# Patient Record
Sex: Female | Born: 1985 | Race: Black or African American | Hispanic: No | Marital: Single | Smoking: Current every day smoker
Health system: Southern US, Community
[De-identification: ages and names within clinical notes are randomized; demographics above are authoritative.]

## PROBLEM LIST (undated history)

## (undated) DIAGNOSIS — J45909 Unspecified asthma, uncomplicated: Secondary | ICD-10-CM

## (undated) DIAGNOSIS — J302 Other seasonal allergic rhinitis: Secondary | ICD-10-CM

## (undated) DIAGNOSIS — T7840XA Allergy, unspecified, initial encounter: Secondary | ICD-10-CM

## (undated) HISTORY — PX: DENTAL SURGERY: SHX609

## (undated) HISTORY — DX: Other seasonal allergic rhinitis: J30.2

## (undated) HISTORY — DX: Unspecified asthma, uncomplicated: J45.909

## (undated) HISTORY — DX: Allergy, unspecified, initial encounter: T78.40XA

---
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
## 2007-05-03 ENCOUNTER — Emergency Department (HOSPITAL_COMMUNITY): Admission: EM | Admit: 2007-05-03 | Discharge: 2007-05-03 | Payer: Self-pay | Admitting: Emergency Medicine

## 2010-07-27 NOTE — Discharge Summary (Signed)
[name], [name]               ACCOUNT NO.:  [id number]   MEDICAL RECORD NO.:  [id number]          PATIENT TYPE:  INP   LOCATION:  5126                         FACILITY:  [facility]   PHYSICIAN:  Marinda Elk, M.D.DATE OF BIRTH:  06-12-1985   DATE OF ADMISSION:  11/21/2005  DATE OF DISCHARGE:  11/25/2005                                 DISCHARGE SUMMARY   CONSULTANTS:  None.   DISCHARGE DIAGNOSES:  1. Bronchitis with bronchospasm component.  2. Allergic rhinitis.  3. Urinary tract infection.   DISCHARGE MEDICATIONS:  1. Prednisone 60 mg taper.  2. Ferrous sulfate 325 mg t.i.d. for three months.  3. Mucinex 600 mg b.i.d. for one week.  4. Advair 250/50 mg inhaled b.i.d. for two weeks.  5. Albuterol 90 mcg MDI two puffs inhaled every 4 hours p.r.n.  6. Levaquin 750 mg for seven days.  7. Nasal saline spray one puff on each nostril b.i.d. for two weeks.  8. Tylenol 650 mg q.4 hours to q.6 hours p.r.n. for fever or for pain.   DISPOSITION:  Patient is to follow up with Dr. Alena Bills.  She was  advised to call and make an appointment with him.  There, she will be  evaluated with peak flows to see patient's status and a CBC to followup on  her hemoglobin.   PROCEDURES PERFORMED:  CT angio to rule out PE.  CT was negative for PE.  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   CONSULTANTS:  None.   ADMITTING H&P:  She is an 25 year old African American female with a past  medical history of allergic rhinitis, that comes to the ED because of  shortness of breath that has gotten worse over the past 2 days.  Patient  relates chest tightness with cough and wheezing, nausea and vomiting for 3  days.  Her cough is productive, green in color.  Her tightness is diffuse.  It is a 5/10 in intensity, but refers that her cough makes it worse.  It has  no alleviating factors.  Pain has no radiation.  She relates that the  week  before some people in her dorm were complaining of some sore throat.   PHYSICAL EXAMINATION:  VITAL SIGNS:  Temperature 98.2.  Blood pressure  135/77.  Pulse 52.  Respirations 22.  Oxygen saturation 88% on room air.  LUNGS:  Poor air movement with diffuse loud wheezing and some rhonchi at the  right lower lobe.  CARDIOVASCULAR:  Regular rate and rhythm with a normal S1 and S2.  GI:  She had positive bowel sounds with no tenderness and nontender to  palpation.   LABS:  Showed a white blood cell of 14.0, H&H of 13.1, 38.1, platelets 312  with an ANC of 13.4 and an MCV of 77.  Sodium 138, potassium 3.3, chloride  107, bicarb 24, BUN 3, creatinine 0.8, glucose 147, lipase 19.  Cardiac  enzymes negative x3.  Urinary pregnancy test negative.  Urine showed ketones  and large leukocytes, but her urine microscopy  showed 7 to 10 white blood  cells with a few bacteria.  UDS was negative and strep pneumo on urine was  negative.   HOSPITAL COURSE:  1. Bronchitis with bronchospasm component.  Patient was started on Avalox      and IV fluids for hydration, steroid, albuterol and Atrovent.  After      her UA came back, she was switched to Levaquin for better coverage.  On      second day of admission, patient was wheezing so she was continued IV      steroids.  On the third day, patient's symptoms improved, so she was      switched to p.o. and was continued on p.o. steroids, Levaquin,      albuterol and Atrovent and was discharged on these medications.  2. Allergic rhinitis.  Patient was started on nasal spray and Allegra,      which improved her nasal congestion.  3. UTI.  during hospitalization urine analysis show white blood and      bacteria,she was treated with Levaquin.   LABS:  White blood cell 13.5, H&H 12.5, 36.8, platelets 322 with an MCV of  78.  Sodium 138, potassium 3.8, chloride 107, bicarb 23, BUN 11, creatinine  0.8, glucose 116.   VITAL SIGNS:  Temperature 98.  Pulse 71.   Respirations 20.  Blood pressure  127/83.  Saturating at 95% on room air.      Marinda Elk, M.D.  Electronically Signed     AF/MEDQ  D:  11/26/2005  T:  11/26/2005  Job:  [id number]   cc:   Fonnie Mu, M.D.

## 2012-02-20 ENCOUNTER — Other Ambulatory Visit: Payer: Self-pay | Admitting: Internal Medicine

## 2012-02-20 ENCOUNTER — Ambulatory Visit (INDEPENDENT_AMBULATORY_CARE_PROVIDER_SITE_OTHER): Payer: BC Managed Care – PPO | Admitting: Physician Assistant

## 2012-02-20 VITALS — BP 136/85 | HR 62 | Temp 98.5°F | Resp 16 | Ht 62.0 in | Wt 190.0 lb

## 2012-02-20 DIAGNOSIS — J029 Acute pharyngitis, unspecified: Secondary | ICD-10-CM

## 2012-02-20 DIAGNOSIS — R059 Cough, unspecified: Secondary | ICD-10-CM

## 2012-02-20 DIAGNOSIS — R05 Cough: Secondary | ICD-10-CM

## 2012-02-20 DIAGNOSIS — Z7251 High risk heterosexual behavior: Secondary | ICD-10-CM

## 2012-02-20 LAB — POCT CBC
Granulocyte percent: 64.8 %G (ref 37–80)
HCT, POC: 38.5 % (ref 37.7–47.9)
Lymph, poc: 2.7 (ref 0.6–3.4)
MCV: 89.1 fL (ref 80–97)
POC LYMPH PERCENT: 29.8 %L (ref 10–50)
RDW, POC: 14.2 %

## 2012-02-20 MED ORDER — FLUCONAZOLE 150 MG PO TABS: 150.0000 mg | ORAL_TABLET | Freq: Once | ORAL | Status: DC

## 2012-02-20 MED ORDER — HYDROCODONE-HOMATROPINE 5-1.5 MG/5ML PO SYRP: ORAL_SOLUTION | ORAL | Status: DC

## 2012-02-20 MED ORDER — AMOXICILLIN-POT CLAVULANATE 875-125 MG PO TABS: 1.0000 | ORAL_TABLET | Freq: Two times a day (BID) | ORAL | Status: DC

## 2012-02-20 NOTE — Progress Notes (Signed)
Patient ID: [name] MRN: [id number], DOB: 06/07/1985, 26 y.o. Date of Encounter: 02/20/2012, 9:11 PM  Primary Physician: No primary provider on file.  Chief Complaint: Pharyngitis and cough  HPI: 25 y.o. year old female with history below presents with complaints of pharyngitis and cough for several days now. She states that she was originally sick the previous week with nasal congestion, sinus pressure, and post nasal drip however those symptoms had resolved and she was feeling better. She then developed the above pharyngitis and cough a few days ago. She states this morning when she was brushing her teeth she looked in the mirror and saw "white spots" prompting her to come in this evening for evaluation. She states that her sore throat is a 5/10 in pain. It only hurts to swallow. Her cough is productive of yellow to green sputum. It is not associated with time of day. She has not had any shortness of breath, wheezing, or chest pain. She has remained afebrile throughout both of her above illnesses. She denies any headaches, otalgia, current sinus pressure, GI, GU, or vaginal symptoms.   She does state that she had an STD check in October that was negative. She is not certain what labs were checked. She states they checked blood, urine, and a pelvic exam was done. She does have a remote history of chlamydia and trichomonas in 2007 that was successfully treated.   She asks this evening if her sore throat could be from an STD. She has engaged in genital to oral intercourse, most recently 3 weeks prior. No new partners. She has been faithful.     Past Medical History  Diagnosis Date  . Asthma   . Seasonal allergies      Home Meds: Prior to Admission medications   Not on File    Allergies: No Known Allergies  History   Social History  . Marital Status: Single    Spouse Name: N/A    Number of Children: N/A  . Years of Education: N/A   Occupational History  . Not on file.    Social History Main Topics  . Smoking status: Current Every Day Smoker  . Smokeless tobacco: Not on file  . Alcohol Use: Yes  . Drug Use: No  . Sexually Active: Not on file   Other Topics Concern  . Not on file   Social History Narrative  . No narrative on file     Review of Systems: Constitutional: negative for chills, fever, night sweats, weight changes, or fatigue  HEENT: see above Cardiovascular: negative for chest pain, tachycardia, or palpitations Respiratory: negative for hemoptysis, wheezing, dyspnea, or shortness of breath Abdominal: negative for abdominal pain, nausea, vomiting, diarrhea, or constipation Dermatological: negative for rash Neurologic: negative for headache, dizziness, or syncope   Physical Exam: Blood pressure 136/85, pulse 62, temperature 98.5 F (36.9 C), temperature source Oral, resp. rate 16, height 5\' 2"  (1.575 m), weight 190 lb (86.183 kg), SpO2 98.00%., Body mass index is 34.75 kg/(m^2). General: Well developed, well nourished, in no acute distress. Head: Normocephalic, atraumatic, eyes without discharge, sclera non-icteric, nares are mildly congested. Bilateral auditory canals clear, TM's are without perforation, pearly grey and translucent with reflective cone of light bilaterally. Oral cavity moist, posterior pharynx erythematous with exudate bilaterally. Mild post nasal drip. No vesicular lesions along the posterior pharynx, mucosa, or gingiva. Tonsils 2+ bilaterally. Noperitonsillar abscess. Uvula midline.  Neck: Supple. No thyromegaly. Full ROM. Lymph nodes: Less than 2 cm AC bilaterally.  Lungs: Clear bilaterally to auscultation without wheezes, rales, or rhonchi. Breathing is unlabored. Heart: RRR with S1 S2. No murmurs, rubs, or gallops appreciated. Abdomen: Soft, non-tender, non-distended with normoactive bowel sounds. No hepatosplenomegaly. No rebound/guarding. No obvious abdominal masses. Negative McBurney's and table jar.  Msk:   Strength and tone normal for age. Extremities/Skin: Warm and dry. No clubbing or cyanosis. No edema. No rashes or suspicious lesions. Neuro: Alert and oriented X 3. Moves all extremities spontaneously. Gait is normal. CNII-XII grossly in tact. Psych:  Responds to questions appropriately with a normal affect.   Labs: Results for orders placed in visit on 02/20/12  POCT CBC      Component Value Range   WBC 8.9  4.6 - 10.2 K/uL   Lymph, poc 2.7  0.6 - 3.4   POC LYMPH PERCENT 29.8  10 - 50 %L   MID (cbc) 0.5  0 - 0.9   POC MID % 5.4  0 - 12 %M   POC Granulocyte 5.8  2 - 6.9   Granulocyte percent 64.8  37 - 80 %G   RBC 4.32  4.04 - 5.48 M/uL   Hemoglobin 11.8 (*) 12.2 - 16.2 g/dL   HCT, POC 14.7  82.9 - 47.9 %   MCV 89.1  80 - 97 fL   MCH, POC 27.3  27 - 31.2 pg   MCHC 30.6 (*) 31.8 - 35.4 g/dL   RDW, POC 56.2     Platelet Count, POC 300  142 - 424 K/uL   MPV 8.7  0 - 99.8 fL  POCT RAPID STREP A (OFFICE)      Component Value Range   Rapid Strep A Screen Negative  Negative   CMP, EBV titers, CMV titers, strep culture, HIV, RPR, HSV II & II, GC throat culture, chlamydia throat culture, and HSV throat culture pending. She would like for [place] to wait until all labs are back to call. We can leave a message on her voicemail, but should be vague at her request.   ASSESSMENT AND PLAN:  26 y.o. year old female with exudative pharyngitis, cough, and high risk sexual behavior.   1) Exudative pharyngitis -Augmentin 875/125 mg 1 po bid #20 no RF -See below, given Hycodan for cough -Await above laboratory work up, treat if needed -She was given a prescription for Diflucan 150 mg 1 po as needed for antibiotic use #1 RF 1  2) Cough -Hycodan #4oz 1 tsp po q 4-6 hours prn cough no RF SED -I explained to her that this medication will also help her with her sore throat  3) High risk sexual behavior -See above labs -Safe sex practices -She will follow up in 3-4 weeks for a repeat HIV as she is  currently 3 weeks out from her last sexual encounter and still may not have seroconverted.    Signed, Eula Listen, PA-C 02/20/2012 9:11 PM

## 2012-02-21 LAB — RPR

## 2012-02-21 LAB — COMPREHENSIVE METABOLIC PANEL
Alkaline Phosphatase: 64 U/L (ref 39–117)
BUN: 8 mg/dL (ref 6–23)
Creat: 0.54 mg/dL (ref 0.50–1.10)
Glucose, Bld: 87 mg/dL (ref 70–99)
Sodium: 139 mEq/L (ref 135–145)
Total Bilirubin: 0.3 mg/dL (ref 0.3–1.2)
Total Protein: 6.9 g/dL (ref 6.0–8.3)

## 2012-02-21 LAB — HIV ANTIBODY (ROUTINE TESTING W REFLEX): HIV: NONREACTIVE

## 2012-02-23 LAB — CULTURE, GROUP A STREP

## 2012-02-24 LAB — EPSTEIN-BARR VIRUS VCA ANTIBODY PANEL
EBV EA IgG: <5 U/mL (ref <9.0)
EBV NA IgG: 194 U/mL — ABNORMAL HIGH (ref <18.0)
EBV VCA IgM: <10 U/mL (ref <36.0)

## 2012-02-24 LAB — CYTOMEGALOVIRUS ANTIBODY, IGG: Cytomegalovirus Ab-IgG: 0.51 (ref <0.90)

## 2012-02-24 LAB — CMV IGM: CMV IgM: 0.11 (ref <0.90)

## 2012-07-18 ENCOUNTER — Ambulatory Visit (INDEPENDENT_AMBULATORY_CARE_PROVIDER_SITE_OTHER): Payer: BC Managed Care – PPO | Admitting: Family Medicine

## 2012-07-18 VITALS — BP 100/70 | HR 86 | Temp 98.8°F | Resp 16 | Ht 62.25 in | Wt 203.4 lb

## 2012-07-18 DIAGNOSIS — Z0000 Encounter for general adult medical examination without abnormal findings: Secondary | ICD-10-CM

## 2012-07-18 DIAGNOSIS — N760 Acute vaginitis: Secondary | ICD-10-CM

## 2012-07-18 DIAGNOSIS — B9689 Other specified bacterial agents as the cause of diseases classified elsewhere: Secondary | ICD-10-CM

## 2012-07-18 MED ORDER — METRONIDAZOLE 500 MG PO TABS: 500.0000 mg | ORAL_TABLET | Freq: Three times a day (TID) | ORAL | Status: DC

## 2012-07-18 NOTE — Progress Notes (Signed)
Is a 27 year old unmarried woman comes in for Pap test. She has implant on insertions inner right axilla. She has occasional spotting and occasional odor. She's having no pain, breast tenderness, other complaints at the present time. Last Pap was 2012.  Patient has a history of abnormal Paps dating back to 2009 but subsequent Paps within normal  Objective: No acute distress Chest: Clear Heart: Regular no murmur Breasts: No masses palpated Skin: Clear Pelvic exam: Normal external female genitalia, normal vagina, normal cervix, normal bimanual Pap taken  Assessment: Normal annual exam  Plan: Pap 2 ordered BV (bacterial vaginosis)  Annual physical exam - Plan: metroNIDAZOLE (FLAGYL) 500 MG tablet  Signed, Elvina Sidle, MD

## 2012-08-17 ENCOUNTER — Encounter: Payer: BC Managed Care – PPO | Admitting: Physician Assistant

## 2012-12-14 ENCOUNTER — Ambulatory Visit (INDEPENDENT_AMBULATORY_CARE_PROVIDER_SITE_OTHER): Payer: BC Managed Care – PPO | Admitting: Family Medicine

## 2012-12-14 VITALS — BP 124/70 | HR 71 | Temp 99.0°F | Resp 16 | Ht 62.0 in | Wt 209.0 lb

## 2012-12-14 DIAGNOSIS — R109 Unspecified abdominal pain: Secondary | ICD-10-CM

## 2012-12-14 DIAGNOSIS — Z0000 Encounter for general adult medical examination without abnormal findings: Secondary | ICD-10-CM

## 2012-12-14 LAB — POCT URINALYSIS DIPSTICK
Bilirubin, UA: NEGATIVE
Blood, UA: NEGATIVE
Glucose, UA: NEGATIVE
Ketones, UA: NEGATIVE
Leukocytes, UA: NEGATIVE
pH, UA: 6.5

## 2012-12-14 LAB — POCT UA - MICROSCOPIC ONLY
Mucus, UA: NEGATIVE
RBC, urine, microscopic: NEGATIVE

## 2012-12-14 LAB — POCT CBC
Granulocyte percent: 55.2 %G (ref 37–80)
HCT, POC: 43.5 % (ref 37.7–47.9)
Hemoglobin: 14.1 g/dL (ref 12.2–16.2)
MPV: 9.2 fL (ref 0–99.8)
POC Granulocyte: 3.8 (ref 2–6.9)
RBC: 4.93 M/uL (ref 4.04–5.48)

## 2012-12-14 LAB — COMPREHENSIVE METABOLIC PANEL
ALT: 24 U/L (ref 0–35)
Albumin: 4.5 g/dL (ref 3.5–5.2)
CO2: 26 mEq/L (ref 19–32)
Calcium: 9.8 mg/dL (ref 8.4–10.5)
Chloride: 105 mEq/L (ref 96–112)
Creat: 0.66 mg/dL (ref 0.50–1.10)
Potassium: 4.2 mEq/L (ref 3.5–5.3)

## 2012-12-14 LAB — AMYLASE: Amylase: 35 U/L (ref 0–105)

## 2012-12-14 MED ORDER — CYCLOBENZAPRINE HCL 10 MG PO TABS: 10.0000 mg | ORAL_TABLET | Freq: Two times a day (BID) | ORAL | Status: DC | PRN

## 2012-12-14 NOTE — Patient Instructions (Addendum)
Your labs look good so far- no sign of a kidney stone.  I will be in touch regarding the rest of your labs when they come in.   Try the flexeril medication as needed for pain.  Remember this can make you drowsy so do not take it when you need to drive.

## 2012-12-14 NOTE — Progress Notes (Signed)
[facility] [address] [place] [address] [phone]  Date:  12/14/2012   Name:  [name]   DOB:  1985-06-16   MRN:  [id number]  PCP:  Nilda Simmer, MD    Chief Complaint: Flank Pain   History of Present Illness:  [name] is a 27 y.o. very pleasant female patient who presents with the following:  She has noted pain in her left side and shoulder since Saturday- today is Monday.  NKI.  It will hurt if she moves in a particular way such as with bending down or stretching to the right.    It is not getting better or worse.  The pain does not run down her leg.  No leg numbness or weakness.  No hematuria, no urinary sx or bowel/ bladder incontinence.  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  However she does wear a heavy service belt each day which she thinks may have irritated her back.    She has never had this in the past.   She has implanon so she rarely has menses  There are no active problems to display for this patient.   Past Medical History  Diagnosis Date  . Asthma   . Seasonal allergies     No past surgical history on file.  History  Substance Use Topics  . Smoking status: Current Every Day Smoker  . Smokeless tobacco: Not on file  . Alcohol Use: 1.0 oz/week    2 drink(s) per week    Family History  Problem Relation Age of Onset  . Cancer Father   . Liver disease Father   . Liver disease Maternal Grandfather     No Known Allergies  Medication list has been reviewed and updated.  Current Outpatient Prescriptions on File Prior to Visit  Medication Sig Dispense Refill  . metroNIDAZOLE (FLAGYL) 500 MG tablet Take 1 tablet (500 mg total) by mouth 3 (three) times daily.  21 tablet  0   No current facility-administered medications on file prior to visit.    Review of Systems:  As per HPI- otherwise  negative.   Physical Examination: Filed Vitals:   12/14/12 0933  BP: 124/70  Pulse: 71  Temp: 99 F (37.2 C)  Resp: 16   Filed Vitals:   12/14/12 0933  Height: 5\' 2"  (1.575 m)  Weight: 209 lb (94.802 kg)   Body mass index is 38.22 kg/(m^2). Ideal Body Weight: Weight in (lb) to have BMI = 25: 136.4  GEN: WDWN, NAD, Non-toxic, A & O x 3, obese HEENT: Atraumatic, Normocephalic. Neck supple. No masses, No LAD. Ears and Nose: No external deformity. CV: RRR, No M/G/R. No JVD. No thrill. No extra heart sounds. PULM: CTA B, no wheezes, crackles, rhonchi. No retractions. No resp. distress. No accessory muscle use. ABD: S, NT, ND, +BS. No rebound. No HSM. She is tender over the left mid back, and in her left mid side (not distinctly in the left abdomen, but in the left side)   Good back flexion.  Negative SLR bilaterally.  Normal LE strength, sensation and DTR EXTR: No c/c/e NEURO Normal gait.  PSYCH: Normally interactive. Conversant. Not depressed or anxious appearing.  Calm demeanor.     Assessment and Plan: Flank pain - Plan: POCT CBC, POCT UA - Microscopic Only, POCT urinalysis dipstick, Comprehensive metabolic panel, Amylase, Lipase, POCT  urine pregnancy  Left sided abdominal pain - Plan: POCT CBC, POCT UA - Microscopic Only, POCT urinalysis dipstick, Comprehensive metabolic panel, Amylase, Lipase, cyclobenzaprine (FLEXERIL) 10 MG tablet  Annual physical exam  MSK strain in the left lower back and obliques.  Her police belt (combined with her weight) may have contributed.  Discussed weight loss and better fitness for her health.  She will use flexeril as needed, and I will be in touch with the rest of her labs.    Signed Abbe Amsterdam, MD  Received the rest of her labs and gave her a call- all normal.  She will let me know if her sx persist.    Results for orders placed in visit on 12/14/12  COMPREHENSIVE METABOLIC PANEL      Result Value Range   Sodium 138  135 - 145  mEq/L   Potassium 4.2  3.5 - 5.3 mEq/L   Chloride 105  96 - 112 mEq/L   CO2 26  19 - 32 mEq/L   Glucose, Bld 86  70 - 99 mg/dL   BUN 10  6 - 23 mg/dL   Creat 6.21  3.08 - 6.57 mg/dL   Total Bilirubin 0.3  0.3 - 1.2 mg/dL   Alkaline Phosphatase 64  39 - 117 U/L   AST 17  0 - 37 U/L   ALT 24  0 - 35 U/L   Total Protein 7.1  6.0 - 8.3 g/dL   Albumin 4.5  3.5 - 5.2 g/dL   Calcium 9.8  8.4 - 84.6 mg/dL  AMYLASE      Result Value Range   Amylase 35  0 - 105 U/L  LIPASE      Result Value Range   Lipase 16  0 - 75 U/L  POCT CBC      Result Value Range   WBC 6.8  4.6 - 10.2 K/uL   Lymph, poc 2.5  0.6 - 3.4   POC LYMPH PERCENT 36.9  10 - 50 %L   MID (cbc) 0.5  0 - 0.9   POC MID % 7.9  0 - 12 %M   POC Granulocyte 3.8  2 - 6.9   Granulocyte percent 55.2  37 - 80 %G   RBC 4.93  4.04 - 5.48 M/uL   Hemoglobin 14.1  12.2 - 16.2 g/dL   HCT, POC 96.2  95.2 - 47.9 %   MCV 88.2  80 - 97 fL   MCH, POC 28.6  27 - 31.2 pg   MCHC 32.4  31.8 - 35.4 g/dL   RDW, POC 84.1     Platelet Count, POC 302  142 - 424 K/uL   MPV 9.2  0 - 99.8 fL  POCT UA - MICROSCOPIC ONLY      Result Value Range   WBC, Ur, HPF, POC 0-1     RBC, urine, microscopic neg     Bacteria, U Microscopic trace     Mucus, UA neg     Epithelial cells, urine per micros 0-1     Crystals, Ur, HPF, POC neg     Casts, Ur, LPF, POC neg     Yeast, UA neg    POCT URINALYSIS DIPSTICK      Result Value Range   Color, UA yellow     Clarity, UA clear     Glucose, UA neg     Bilirubin, UA neg     Ketones, UA neg     Spec Grav,  UA 1.015     Blood, UA neg     pH, UA 6.5     Protein, UA neg     Urobilinogen, UA 0.2     Nitrite, UA neg     Leukocytes, UA Negative    POCT URINE PREGNANCY      Result Value Range   Preg Test, Ur Negative

## 2014-02-01 ENCOUNTER — Encounter: Payer: Self-pay | Admitting: Family Medicine

## 2014-02-01 ENCOUNTER — Ambulatory Visit (INDEPENDENT_AMBULATORY_CARE_PROVIDER_SITE_OTHER): Payer: BC Managed Care – PPO | Admitting: Family Medicine

## 2014-02-01 VITALS — BP 130/74 | HR 82 | Temp 98.2°F | Resp 16 | Ht 62.5 in | Wt 217.4 lb

## 2014-02-01 DIAGNOSIS — Z0000 Encounter for general adult medical examination without abnormal findings: Secondary | ICD-10-CM

## 2014-02-01 DIAGNOSIS — Z131 Encounter for screening for diabetes mellitus: Secondary | ICD-10-CM

## 2014-02-01 DIAGNOSIS — Z13220 Encounter for screening for lipoid disorders: Secondary | ICD-10-CM

## 2014-02-01 DIAGNOSIS — Z7251 High risk heterosexual behavior: Secondary | ICD-10-CM

## 2014-02-01 LAB — COMPLETE METABOLIC PANEL WITH GFR
ALK PHOS: 78 U/L (ref 39–117)
ALT: 17 U/L (ref 0–35)
AST: 16 U/L (ref 0–37)
Albumin: 4.3 g/dL (ref 3.5–5.2)
BILIRUBIN TOTAL: 0.4 mg/dL (ref 0.2–1.2)
BUN: 12 mg/dL (ref 6–23)
CO2: 25 mEq/L (ref 19–32)
Calcium: 9 mg/dL (ref 8.4–10.5)
Chloride: 106 mEq/L (ref 96–112)
Creat: 0.63 mg/dL (ref 0.50–1.10)
GFR, Est African American: >89 mL/min
GFR, Est Non African American: >89 mL/min
Glucose, Bld: 85 mg/dL (ref 70–99)
Potassium: 4.2 mEq/L (ref 3.5–5.3)
SODIUM: 139 meq/L (ref 135–145)
TOTAL PROTEIN: 6.8 g/dL (ref 6.0–8.3)

## 2014-02-01 LAB — POCT URINALYSIS DIPSTICK
BILIRUBIN UA: NEGATIVE
Glucose, UA: NEGATIVE
KETONES UA: NEGATIVE
LEUKOCYTES UA: NEGATIVE
Nitrite, UA: NEGATIVE
PH UA: 6.5
Protein, UA: NEGATIVE
RBC UA: NEGATIVE
Spec Grav, UA: <=1.005
Urobilinogen, UA: 0.2

## 2014-02-01 LAB — LIPID PANEL
CHOL/HDL RATIO: 4.3 ratio
Cholesterol: 185 mg/dL (ref 0–200)
HDL: 43 mg/dL (ref >39)
LDL CALC: 128 mg/dL — AB (ref 0–99)
Triglycerides: 71 mg/dL (ref <150)
VLDL: 14 mg/dL (ref 0–40)

## 2014-02-01 LAB — HEMOGLOBIN A1C
HEMOGLOBIN A1C: 5.7 % — AB (ref <5.7)
Mean Plasma Glucose: 117 mg/dL — ABNORMAL HIGH (ref <117)

## 2014-02-01 NOTE — Progress Notes (Signed)
Subjective:    Patient ID: [name], female    DOB: 11/17/1985, 28 y.o.   MRN: [id number]  HPI Patient presents today for CPE. She does not wish to have her gyn exam today.   Last CPE- unknown Last PAP- a little over a year ago. Has had abnormal in past with biopsy. Flu- declines Tdap- she believes she had to have for her job in the last couple of years Dental- brushes and flosses regularly, due for cleaning Eye- will make opthalmology appointment to evaluate dry eyes   The patient is a Engineer, drillingprobation officer. She enjoys her work and admits it can be stressful. She does a combination of desk work and walking with her job. She does not cook, so she eats fast food twice a day. She drinks 2-3 can regular soda a day. She does not exercise regularly although she has been trying to walk more during the day.   Past Medical History  Diagnosis Date  . Asthma   . Seasonal allergies    No past surgical history on file. Family History  Problem Relation Age of Onset  . Cancer Father   . Liver disease Father   . Liver disease Maternal Grandfather    History  Substance Use Topics  . Smoking status: Current Every Day Smoker  . Smokeless tobacco: Not on file  . Alcohol Use: 1.0 oz/week    2 drink(s) per week    Review of Systems  Constitutional: Negative.   HENT: Negative.   Eyes: Negative.   Respiratory: Negative.   Cardiovascular: Negative.   Gastrointestinal: Negative.   Endocrine: Negative.   Genitourinary: Negative.   Musculoskeletal: Negative.   Skin: Negative.   Allergic/Immunologic: Negative.   Neurological: Negative.   Hematological: Negative.   Psychiatric/Behavioral: Negative.       Objective:   Physical Exam  Constitutional: She is oriented to person, place, and time. She appears well-developed and well-nourished.  obese  HENT:  Head: Normocephalic and atraumatic.  Right Ear: Tympanic membrane, external ear and ear canal normal.  Left Ear: Tympanic membrane,  external ear and ear canal normal.  Nose: Nose normal.  Mouth/Throat: Oropharynx is clear and moist. No oropharyngeal exudate.  Eyes: Conjunctivae and EOM are normal. Pupils are equal, round, and reactive to light.  Neck: Normal range of motion. Neck supple.  Cardiovascular: Normal rate, regular rhythm, normal heart sounds and intact distal pulses.   Pulmonary/Chest: Effort normal and breath sounds normal.  Abdominal: Soft. Bowel sounds are normal. She exhibits no distension and no mass. There is no tenderness. There is no rebound and no guarding.  Musculoskeletal: Normal range of motion.  Neurological: She is alert and oriented to person, place, and time. She has normal reflexes.  Skin: Skin is warm and dry.  Psychiatric: She has a normal mood and affect. Her behavior is normal. Judgment and thought content normal.  Vitals reviewed.  BP 130/74 mmHg  Pulse 82  Temp(Src) 98.2 F (36.8 C) (Oral)  Resp 16  Ht 5' 2.5" (1.588 m)  Wt 217 lb 6.4 oz (98.612 kg)  BMI 39.10 kg/m2  SpO2 100%     Assessment & Plan:  1. Physical exam -Patient did not want her gyn exam today- she is to return for this  2. High risk sexual behavior - RPR - HIV antibody - GC/Chlamydia Probe Amp - POCT urinalysis dipstick  3. Screening, lipid - Lipid panel - POCT urinalysis dipstick  4. Screening for diabetes mellitus - COMPLETE  METABOLIC PANEL WITH GFR - Hemoglobin A1c  5. Severe obesity - Encouraged patient to decrease fast food intake and discussed healthy options that don't require cooking - Encouraged increased physical activity and goal of 1/2 pound weight loss per week  Emi Belfasteborah B. Tarance Balan, FNP-BC  [facility]  02/03/2014 9:03 AM

## 2014-02-01 NOTE — Patient Instructions (Signed)
Schedule PAP when you can Decrease soda consumption to 1 can a week- you can do it! Eat more frequently- find something you like to eat for breakfast. Keep easy to prepare foods on hand so you can avoid the drive through. Aim for 1/2-1 pound of weight loss per week- weigh yourself regularly Move more- walk, dance, do an exercise routine from [employer] - it all counts!  Pick a stop smoking date  Smoking Cessation, Tips for Success If you are ready to quit smoking, congratulations! You have chosen to help yourself be healthier. Cigarettes bring nicotine, tar, carbon monoxide, and other irritants into your body. Your lungs, heart, and blood vessels will be able to work better without these poisons. There are many different ways to quit smoking. Nicotine gum, nicotine patches, a nicotine inhaler, or nicotine nasal spray can help with physical craving. Hypnosis, support groups, and medicines help break the habit of smoking. WHAT THINGS CAN I DO TO MAKE QUITTING EASIER?  Here are some tips to help you quit for good:  Pick a date when you will quit smoking completely. Tell all of your friends and family about your plan to quit on that date.  Do not try to slowly cut down on the number of cigarettes you are smoking. Pick a quit date and quit smoking completely starting on that day.  Throw away all cigarettes.   Clean and remove all ashtrays from your home, work, and car.  On a card, write down your reasons for quitting. Carry the card with you and read it when you get the urge to smoke.  Cleanse your body of nicotine. Drink enough water and fluids to keep your urine clear or pale yellow. Do this after quitting to flush the nicotine from your body.  Learn to predict your moods. Do not let a bad situation be your excuse to have a cigarette. Some situations in your life might tempt you into wanting a cigarette.  Never have "just one" cigarette. It leads to wanting another and another. Remind yourself  of your decision to quit.  Change habits associated with smoking. If you smoked while driving or when feeling stressed, try other activities to replace smoking. Stand up when drinking your coffee. Brush your teeth after eating. Sit in a different chair when you read the paper. Avoid alcohol while trying to quit, and try to drink fewer caffeinated beverages. Alcohol and caffeine may urge you to smoke.  Avoid foods and drinks that can trigger a desire to smoke, such as sugary or spicy foods and alcohol.  Ask people who smoke not to smoke around you.  Have something planned to do right after eating or having a cup of coffee. For example, plan to take a walk or exercise.  Try a relaxation exercise to calm you down and decrease your stress. Remember, you may be tense and nervous for the first 2 weeks after you quit, but this will pass.  Find new activities to keep your hands busy. Play with a pen, coin, or rubber band. Doodle or draw things on paper.  Brush your teeth right after eating. This will help cut down on the craving for the taste of tobacco after meals. You can also try mouthwash.   Use oral substitutes in place of cigarettes. Try using lemon drops, carrots, cinnamon sticks, or chewing gum. Keep them handy so they are available when you have the urge to smoke.  When you have the urge to smoke, try deep breathing.  Designate your home as a nonsmoking area.  If you are a heavy smoker, ask your health care provider about a prescription for nicotine chewing gum. It can ease your withdrawal from nicotine.  Reward yourself. Set aside the cigarette money you save and buy yourself something nice.  Look for support from others. Join a support group or smoking cessation program. Ask someone at home or at work to help you with your plan to quit smoking.  Always ask yourself, "Do I need this cigarette or is this just a reflex?" Tell yourself, "Today, I choose not to smoke," or "I do not want to  smoke." You are reminding yourself of your decision to quit.  Do not replace cigarette smoking with electronic cigarettes (commonly called e-cigarettes). The safety of e-cigarettes is unknown, and some may contain harmful chemicals.  If you relapse, do not give up! Plan ahead and think about what you will do the next time you get the urge to smoke. HOW WILL I FEEL WHEN I QUIT SMOKING? You may have symptoms of withdrawal because your body is used to nicotine (the addictive substance in cigarettes). You may crave cigarettes, be irritable, feel very hungry, cough often, get headaches, or have difficulty concentrating. The withdrawal symptoms are only temporary. They are strongest when you first quit but will go away within 10-14 days. When withdrawal symptoms occur, stay in control. Think about your reasons for quitting. Remind yourself that these are signs that your body is healing and getting used to being without cigarettes. Remember that withdrawal symptoms are easier to treat than the major diseases that smoking can cause.  Even after the withdrawal is over, expect periodic urges to smoke. However, these cravings are generally short lived and will go away whether you smoke or not. Do not smoke! WHAT RESOURCES ARE AVAILABLE TO HELP ME QUIT SMOKING? Your health care provider can direct you to community resources or hospitals for support, which may include:  Group support.  Education.  Hypnosis.  Therapy. Document Released: 11/24/2003 Document Revised: 07/12/2013 Document Reviewed: 08/13/2012 [facility] Patient Information 2015 [place]. This information is not intended to replace advice given to you by your health care provider. Make sure you discuss any questions you have with your health care provider.        Why follow it? Research shows. . Those who follow the Mediterranean diet have a reduced risk of heart disease  . The diet is associated with a reduced incidence of Parkinson's  and Alzheimer's diseases . People following the diet may have longer life expectancies and lower rates of chronic diseases  . The Dietary Guidelines for Americans recommends the Mediterranean diet as an eating plan to promote health and prevent disease  What Is the Mediterranean Diet?  . Healthy eating plan based on typical foods and recipes of Mediterranean-style cooking . The diet is primarily a plant based diet; these foods should make up a majority of meals   Starches - Plant based foods should make up a majority of meals - They are an important sources of vitamins, minerals, energy, antioxidants, and fiber - Choose whole grains, foods high in fiber and minimally processed items  - Typical grain sources include wheat, oats, barley, corn, brown rice, bulgar, farro, millet, polenta, couscous  - Various types of beans include chickpeas, lentils, fava beans, black beans, white beans   Fruits  Veggies - Large quantities of antioxidant rich fruits & veggies; 6 or more servings  - Vegetables can be eaten  raw or lightly drizzled with oil and cooked  - Vegetables common to the traditional Mediterranean Diet include: artichokes, arugula, beets, broccoli, brussel sprouts, cabbage, carrots, celery, collard greens, cucumbers, eggplant, kale, leeks, lemons, lettuce, mushrooms, okra, onions, peas, peppers, potatoes, pumpkin, radishes, rutabaga, shallots, spinach, sweet potatoes, turnips, zucchini - Fruits common to the Mediterranean Diet include: apples, apricots, avocados, cherries, clementines, dates, figs, grapefruits, grapes, melons, nectarines, oranges, peaches, pears, pomegranates, strawberries, tangerines  Fats - Replace butter and margarine with healthy oils, such as olive oil, canola oil, and tahini  - Limit nuts to no more than a handful a day  - Nuts include walnuts, almonds, pecans, pistachios, pine nuts  - Limit or avoid candied, honey roasted or heavily salted nuts - Olives are central to the  [place] - can be eaten whole or used in a variety of dishes   Meats Protein - Limiting red meat: no more than a few times a month - When eating red meat: choose lean cuts and keep the portion to the size of deck of cards - Eggs: approx. 0 to 4 times a week  - Fish and lean poultry: at least 2 a week  - Healthy protein sources include, chicken, [place], lean beef, lamb - Increase intake of seafood such as tuna, salmon, trout, mackerel, shrimp, scallops - Avoid or limit high fat processed meats such as sausage and bacon  Dairy - Include moderate amounts of low fat dairy products  - Focus on healthy dairy such as fat free yogurt, skim milk, low or reduced fat cheese - Limit dairy products higher in fat such as whole or 2% milk, cheese, ice cream  Alcohol - Moderate amounts of red wine is ok  - No more than 5 oz daily for women (all ages) and men older than age 60  - No more than 10 oz of wine daily for men younger than 28  Other - Limit sweets and other desserts  - Use herbs and spices instead of salt to flavor foods  - Herbs and spices common to the traditional Mediterranean Diet include: basil, bay leaves, chives, cloves, cumin, fennel, garlic, lavender, marjoram, mint, oregano, parsley, pepper, rosemary, sage, savory, sumac, tarragon, thyme   It's not just a diet, it's a lifestyle:  . The Mediterranean diet includes lifestyle factors typical of those in the region  . Foods, drinks and meals are best eaten with others and savored . Daily physical activity is important for overall good health . This could be strenuous exercise like running and aerobics . This could also be more leisurely activities such as walking, housework, yard-work, or taking the stairs . Moderation is the key; a balanced and healthy diet accommodates most foods and drinks . Consider portion sizes and frequency of consumption of certain foods   Meal Ideas & Options:  . Breakfast:  o Whole wheat toast or whole  wheat English muffins with peanut butter & hard boiled egg o Steel cut oats topped with apples & cinnamon and skim milk  o Fresh fruit: banana, strawberries, melon, berries, peaches  o Smoothies: strawberries, bananas, greek yogurt, peanut butter o Low fat greek yogurt with blueberries and granola  o Egg white omelet with spinach and mushrooms o Breakfast couscous: whole wheat couscous, apricots, skim milk, cranberries  . Sandwiches:  o Hummus and grilled vegetables (peppers, zucchini, squash) on whole wheat bread   o Grilled chicken on whole wheat pita with lettuce, tomatoes, cucumbers or tzatziki  o [employer]  on whole wheat bread: tuna salad made with greek yogurt, olives, red peppers, capers, green onions o Garlic rosemary lamb pita: lamb sauted with garlic, rosemary, salt & pepper; add lettuce, cucumber, greek yogurt to pita - flavor with lemon juice and black pepper  . Seafood:  o Mediterranean grilled salmon, seasoned with garlic, basil, parsley, lemon juice and black pepper o Shrimp, lemon, and spinach whole-grain pasta salad made with low fat greek yogurt  o Seared scallops with lemon orzo  o Seared tuna steaks seasoned salt, pepper, coriander topped with tomato mixture of olives, tomatoes, olive oil, minced garlic, parsley, green onions and cappers  . Meats:  o Herbed greek chicken salad with kalamata olives, cucumber, feta  o Red bell peppers stuffed with spinach, bulgur, lean ground beef (or lentils) & topped with feta   o Kebabs: skewers of chicken, tomatoes, onions, zucchini, squash  o [place] burgers: made with red onions, mint, dill, lemon juice, feta cheese topped with roasted red peppers . Vegetarian o Cucumber salad: cucumbers, artichoke hearts, celery, red onion, feta cheese, tossed in olive oil & lemon juice  o Hummus and whole grain pita points with a greek salad (lettuce, tomato, feta, olives, cucumbers, red onion) o Lentil soup with celery, carrots made with vegetable  broth, garlic, salt and pepper  o Tabouli salad: parsley, bulgur, mint, scallions, cucumbers, tomato, radishes, lemon juice, olive oil, salt and pepper.

## 2014-02-02 ENCOUNTER — Telehealth: Payer: Self-pay | Admitting: Radiology

## 2014-02-02 LAB — GC/CHLAMYDIA PROBE AMP
CT Probe RNA: NEGATIVE
GC Probe RNA: NEGATIVE

## 2014-02-02 LAB — HIV ANTIBODY (ROUTINE TESTING W REFLEX): HIV: NONREACTIVE

## 2014-02-02 LAB — RPR

## 2014-02-02 NOTE — Telephone Encounter (Signed)
Gave pt. results

## 2014-04-06 ENCOUNTER — Ambulatory Visit (INDEPENDENT_AMBULATORY_CARE_PROVIDER_SITE_OTHER): Payer: BC Managed Care – PPO | Admitting: Sports Medicine

## 2014-04-06 VITALS — BP 118/78 | HR 106 | Temp 98.9°F | Resp 18 | Ht 62.25 in | Wt 218.0 lb

## 2014-04-06 DIAGNOSIS — J029 Acute pharyngitis, unspecified: Secondary | ICD-10-CM

## 2014-04-06 MED ORDER — AMOXICILLIN 500 MG PO CAPS: 500.0000 mg | ORAL_CAPSULE | Freq: Two times a day (BID) | ORAL | Status: AC

## 2014-04-06 MED ORDER — FLUCONAZOLE 150 MG PO TABS: 150.0000 mg | ORAL_TABLET | Freq: Once | ORAL | Status: DC

## 2014-04-06 NOTE — Progress Notes (Signed)
   Subjective:    Patient ID: [name], female    DOB: 07/13/1985, 29 y.o.   MRN: [id number]  HPI  [name] is a 29 year-old female who presents with sore throat x 3 weeks. She noticed "flu-like symptoms" at onset. This improved, but she then noticed throat pain that recurred. Tried throat spray. Her flu-like symptoms resolved, now with just throat pain. Sick Contacts: Nephews with Strep Throat 1 week prior. Mild associated lymphadenopathy.  Travel: No  PMHx: Asthma, Seasonal Allergies. 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 No risk factors for gonococcal pharyngitis.  No dysphagnia. No GERD No Cough No Fever No Chills No Malaise No Shortness of breath No Nasal Drainage No Nasal Pressure No Headache No Wt loss No night sweats No Nausea No Vomiting No abdominal pain Review of Systems See above    Objective:   Physical Exam BP 118/78 mmHg  Pulse 106  Temp(Src) 98.9 F (37.2 C) (Oral)  Resp 18  Ht 5' 2.25" (1.581 m)  Wt 218 lb (98.884 kg)  BMI 39.56 kg/m2  SpO2 98% General appearance: alert, cooperative and appears stated age Head: Normocephalic, without obvious abnormality, atraumatic Eyes: conjunctivae/corneas clear. PERRL, EOM's intact. Fundi benign. Ears: normal TM's and external ear canals both ears Nose: turbinates red, swollen Throat: bilateral +1 tonsillar englargement with small purulence to right tonsil. No uvular deviation. Airway patent. Neck: moderate anterior cervical adenopathy, no carotid bruit, no JVD, supple, symmetrical, trachea midline and thyroid not enlarged, symmetric, no tenderness/mass/nodules Lungs: clear to auscultation bilaterally Heart: regular rate and rhythm, S1, S2 normal, no murmur, click, rub or gallop     Assessment & Plan:  1. Pharyngitis, given hx and timecourse of 2 known Strep exposures, likely due to acute bacterial pharyngitis. Does  not appear candidal or gonococcal (less purulence, no GU sx). -Tx empirically given timecourse of symptoms, findings on exam. Amox 500mg  bid x10 days. -Supportive Cares Discussed -Per patient request, diflucan 150mg  #2 tabs sent in as she says she gets yeast infections when she takes anti-biotics. Prevention strategies discussed. -Tobacco cessation counseling discussed extensively, 5 minutes of this visit. -RTC if sx persist, consider broadening coverage, STD testing  Dr. Joellyn HaffPick-Jacobs, DO Sports Medicine Fellow

## 2014-04-06 NOTE — Patient Instructions (Signed)

## 2014-09-13 ENCOUNTER — Ambulatory Visit (INDEPENDENT_AMBULATORY_CARE_PROVIDER_SITE_OTHER): Payer: BC Managed Care – PPO | Admitting: Family Medicine

## 2014-09-13 ENCOUNTER — Encounter: Payer: Self-pay | Admitting: Family Medicine

## 2014-09-13 VITALS — BP 115/77 | HR 99 | Temp 98.0°F | Resp 16 | Ht 62.0 in | Wt 208.0 lb

## 2014-09-13 DIAGNOSIS — Z6838 Body mass index (BMI) 38.0-38.9, adult: Secondary | ICD-10-CM

## 2014-09-13 DIAGNOSIS — Z008 Encounter for other general examination: Secondary | ICD-10-CM

## 2014-09-13 DIAGNOSIS — E669 Obesity, unspecified: Secondary | ICD-10-CM | POA: Insufficient documentation

## 2014-09-13 NOTE — Progress Notes (Signed)
   Subjective:    Patient ID: [name] P Flatt, female    DOB: 01/13/1986, 29 y.o.   MRN: [id number]  HPI Patient presents today to discuss weight loss. [facility] and is taking Qsymia. She is interested in having a different provider prescribe this for her. She has lost 10 pounds in the last 6 months. She denies side effects. She is currently in grad school full time working on her masters in education and she works full time as a Engineer, drillingprobation officer. She does not get regular exercise other than walking in her daily activities. She tried a food tracking application but found it cumbersome to enter all her food. She has decreased soda intake. She does not weigh at home, but goes by how her clothes fit.   24 hour diet recall: doesn't usually eat breakfast, salad for lunch (eats [employer] 3x/ week), shake for dinner. Snack- cheez its, fruit.   [facility], slightly elevated total cholesterol and triglycerides.   Review of Systems No chest pain, no SOB, no palpitations, no edema. Sleeps well.     Objective:   Physical Exam Physical Exam  Constitutional: Oriented to person, place, and time. She appears well-developed and well-nourished.  HENT:  Head: Normocephalic and atraumatic.  Eyes: Conjunctivae are normal.  Neck: Normal range of motion. Neck supple.  Cardiovascular: Normal rate, regular rhythm and normal heart sounds.   Pulmonary/Chest: Effort normal and breath sounds normal.  Musculoskeletal: Normal range of motion.  Neurological: Alert and oriented to person, place, and time.  Skin: Skin is warm and dry.  Psychiatric: Normal mood and affect. Behavior is normal. Judgment and thought content normal.  Vitals reviewed.  BP 115/77 mmHg  Pulse 99  Temp(Src) 98 F (36.7 C)  Resp 16  Ht 5\' 2"  (1.575 m)  Wt 208 lb (94.348 kg)  BMI 38.03 kg/m2 Wt Readings from Last 3  Encounters:  09/13/14 208 lb (94.348 kg)  04/06/14 218 lb (98.884 kg)  02/01/14 217 lb 6.4 oz (98.612 kg)      Assessment & Plan:  1. BMI 38.0-38.9,adult - explained that we don't prescribe weight loss medications here - encouraged avoidance of processed foods and to eat more lean proteins, fresh vegetables and fruits. Instructed her to avoid calorie containing beverages, white breads/pasta/rice/potatoes/desserts. - Encouraged her to keep a food log  - [facility] and [facility], both of whom do medical management weight loss.   Olean Reeeborah Jun Rightmyer, FNP-BC  [facility], [facility]  09/13/2014 8:37 AM

## 2014-10-17 ENCOUNTER — Ambulatory Visit (INDEPENDENT_AMBULATORY_CARE_PROVIDER_SITE_OTHER): Payer: BC Managed Care – PPO | Admitting: Family Medicine

## 2014-10-17 ENCOUNTER — Encounter: Payer: Self-pay | Admitting: Family Medicine

## 2014-10-17 VITALS — BP 124/81 | HR 109 | Temp 98.4°F | Resp 16 | Ht 62.5 in | Wt 198.6 lb

## 2014-10-17 DIAGNOSIS — Z139 Encounter for screening, unspecified: Secondary | ICD-10-CM

## 2014-10-17 DIAGNOSIS — Z124 Encounter for screening for malignant neoplasm of cervix: Secondary | ICD-10-CM | POA: Diagnosis not present

## 2014-10-17 DIAGNOSIS — Z113 Encounter for screening for infections with a predominantly sexual mode of transmission: Secondary | ICD-10-CM | POA: Diagnosis not present

## 2014-10-17 DIAGNOSIS — Z01419 Encounter for gynecological examination (general) (routine) without abnormal findings: Secondary | ICD-10-CM | POA: Diagnosis not present

## 2014-10-17 DIAGNOSIS — Z1389 Encounter for screening for other disorder: Secondary | ICD-10-CM

## 2014-10-17 DIAGNOSIS — E669 Obesity, unspecified: Secondary | ICD-10-CM

## 2014-10-17 LAB — POCT URINALYSIS DIPSTICK
Blood, UA: NEGATIVE
Glucose, UA: NEGATIVE
LEUKOCYTES UA: NEGATIVE
Nitrite, UA: NEGATIVE
PH UA: 6
PROTEIN UA: NEGATIVE
SPEC GRAV UA: 1.02
UROBILINOGEN UA: 0.2

## 2014-10-17 LAB — HEMOGLOBIN A1C
Hgb A1c MFr Bld: 5.6 % (ref <5.7)
Mean Plasma Glucose: 114 mg/dL (ref <117)

## 2014-10-17 NOTE — Progress Notes (Signed)
   Subjective:    Patient ID: [name], female    DOB: 1985-04-05, 29 y.o.   MRN: [id number]  HPI This is a pleasant 29 yo female who presents for routine gynecologic exam. She gets her physicals in October with her job. She desires STD testing today. She is not currently in a relationship. She has a nexplanon that is working well for her. She is not having menses.   She continues to be very busy with her job and school schedule. She is working on weight loss and has lost 10 pounds in the last month with Qsymia, increased water intake and carefully watching her diet. She smokes about 1/3 ppd and plans on working on her smoking cessation at a later date.   Review of Systems No chest pain, no SOB, no palpitations, no abdominal pain    Objective:   Physical Exam  Constitutional: She is oriented to person, place, and time. She appears well-developed and well-nourished.  HENT:  Head: Normocephalic and atraumatic.  Eyes: Conjunctivae are normal.  Neck: Normal range of motion. Neck supple.  Pulmonary/Chest: Effort normal.  Genitourinary: Pelvic exam was performed with patient supine. No labial fusion. There is no rash, tenderness, lesion or injury on the right labia. There is no rash, tenderness, lesion or injury on the left labia. Cervix exhibits no discharge and no friability. No erythema, tenderness or bleeding in the vagina. Vaginal discharge (small amount thin white) found.  Musculoskeletal: Normal range of motion.  Neurological: She is alert and oriented to person, place, and time.  Skin: Skin is warm and dry.  Psychiatric: She has a normal mood and affect. Her behavior is normal. Judgment and thought content normal.  Vitals reviewed.  BP 124/81 mmHg  Pulse 109  Temp(Src) 98.4 F (36.9 C) (Oral)  Resp 16  Ht 5' 2.5" (1.588 m)  Wt 198 lb 9.6 oz (90.084 kg)  BMI 35.72 kg/m2 Wt Readings from Last 3 Encounters:  10/17/14 198 lb 9.6 oz (90.084 kg)  09/13/14 208 lb (94.348 kg)    04/06/14 218 lb (98.884 kg)      Assessment & Plan:  1. Obesity - Hemoglobin A1c - Encouraged continued healthy eating and weight loss  2. Encounter for routine gynecological examination  3. Cervical cancer screening - Pap IG, CT/NG w/ reflex HPV when ASC-U  4. Screening for STD (sexually transmitted disease) - RPR - HIV antibody  5. Screening for hematuria or proteinuria - POCT urinalysis dipstick   Olean Ree, FNP-BC  [facility], [facility]  10/17/2014 9:11 AM

## 2014-10-18 LAB — RPR

## 2014-10-18 LAB — HIV ANTIBODY (ROUTINE TESTING W REFLEX): HIV 1&2 Ab, 4th Generation: NONREACTIVE

## 2014-10-19 ENCOUNTER — Other Ambulatory Visit: Payer: Self-pay | Admitting: Family Medicine

## 2014-10-19 DIAGNOSIS — B9689 Other specified bacterial agents as the cause of diseases classified elsewhere: Secondary | ICD-10-CM

## 2014-10-19 DIAGNOSIS — N760 Acute vaginitis: Principal | ICD-10-CM

## 2014-10-19 LAB — PAP IG, CT-NG, RFX HPV ASCU
CHLAMYDIA PROBE AMP: NEGATIVE
GC PROBE AMP: NEGATIVE

## 2014-10-19 MED ORDER — METRONIDAZOLE 0.75 % VA GEL: 1.0000 | Freq: Every day | VAGINAL | Status: DC

## 2014-10-21 ENCOUNTER — Other Ambulatory Visit: Payer: Self-pay | Admitting: Family Medicine

## 2014-10-21 ENCOUNTER — Telehealth: Payer: Self-pay | Admitting: Family Medicine

## 2014-10-21 MED ORDER — FLUCONAZOLE 150 MG PO TABS: 150.0000 mg | ORAL_TABLET | Freq: Once | ORAL | Status: DC

## 2014-10-21 NOTE — Telephone Encounter (Signed)
-----   Message from Emi Belfast, FNP sent at 10/21/2014  8:28 AM EDT ----- Will you please call her and let her know I sent in diflucan for her if she gets vaginal yeast symptoms with metrogel.

## 2014-10-21 NOTE — Telephone Encounter (Signed)
Left message on machine to puck RX up

## 2015-02-01 ENCOUNTER — Ambulatory Visit (INDEPENDENT_AMBULATORY_CARE_PROVIDER_SITE_OTHER): Payer: BC Managed Care – PPO | Admitting: Family Medicine

## 2015-02-01 VITALS — BP 120/78 | HR 72 | Temp 98.8°F | Resp 18 | Ht 63.0 in | Wt 188.0 lb

## 2015-02-01 DIAGNOSIS — N760 Acute vaginitis: Secondary | ICD-10-CM | POA: Diagnosis not present

## 2015-02-01 LAB — POC MICROSCOPIC URINALYSIS (UMFC): Mucus: ABSENT

## 2015-02-01 LAB — POCT URINALYSIS DIP (MANUAL ENTRY)
Bilirubin, UA: NEGATIVE
Blood, UA: NEGATIVE
Glucose, UA: NEGATIVE
Ketones, POC UA: NEGATIVE
Leukocytes, UA: NEGATIVE
Nitrite, UA: NEGATIVE
Protein Ur, POC: NEGATIVE
Spec Grav, UA: 1.015
Urobilinogen, UA: 0.2
pH, UA: 7.5

## 2015-02-01 LAB — POCT WET + KOH PREP: Trich by wet prep: ABSENT

## 2015-02-01 MED ORDER — METRONIDAZOLE 500 MG PO TABS: 500.0000 mg | ORAL_TABLET | Freq: Two times a day (BID) | ORAL | Status: DC

## 2015-02-01 MED ORDER — FLUCONAZOLE 150 MG PO TABS: 150.0000 mg | ORAL_TABLET | Freq: Once | ORAL | Status: DC

## 2015-02-01 NOTE — Patient Instructions (Signed)
Monilial Vaginitis Vaginitis in a soreness, swelling and redness (inflammation) of the vagina and vulva. Monilial vaginitis is not a sexually transmitted infection. CAUSES  Yeast vaginitis is caused by yeast (candida) that is normally found in your vagina. With a yeast infection, the candida has overgrown in number to a point that upsets the chemical balance. SYMPTOMS   White, thick vaginal discharge.  Swelling, itching, redness and irritation of the vagina and possibly the lips of the vagina (vulva).  Burning or painful urination.  Painful intercourse. DIAGNOSIS  Things that may contribute to monilial vaginitis are:  Postmenopausal and virginal states.  Pregnancy.  Infections.  Being tired, sick or stressed, especially if you had monilial vaginitis in the past.  Diabetes. Good control will help lower the chance.  Birth control pills.  Tight fitting garments.  Using bubble bath, feminine sprays, douches or deodorant tampons.  Taking certain medications that kill germs (antibiotics).  Sporadic recurrence can occur if you become ill. TREATMENT  Your caregiver will give you medication.  There are several kinds of anti monilial vaginal creams and suppositories specific for monilial vaginitis. For recurrent yeast infections, use a suppository or cream in the vagina 2 times a week, or as directed.  Anti-monilial or steroid cream for the itching or irritation of the vulva may also be used. Get your caregiver's permission.  Painting the vagina with methylene blue solution may help if the monilial cream does not work.  Eating yogurt may help prevent monilial vaginitis. HOME CARE INSTRUCTIONS   Finish all medication as prescribed.  Do not have sex until treatment is completed or after your caregiver tells you it is okay.  Take warm sitz baths.  Do not douche.  Do not use tampons, especially scented ones.  Wear cotton underwear.  Avoid tight pants and panty  hose.  Tell your sexual partner that you have a yeast infection. They should go to their caregiver if they have symptoms such as mild rash or itching.  Your sexual partner should be treated as well if your infection is difficult to eliminate.  Practice safer sex. Use condoms.  Some vaginal medications cause latex condoms to fail. Vaginal medications that harm condoms are:  Cleocin cream.  Butoconazole (Femstat).  Terconazole (Terazol) vaginal suppository.  Miconazole (Monistat) (may be purchased over the counter). SEEK MEDICAL CARE IF:   You have a temperature by mouth above 102 F (38.9 C).  The infection is getting worse after 2 days of treatment.  The infection is not getting better after 3 days of treatment.  You develop blisters in or around your vagina.  You develop vaginal bleeding, and it is not your menstrual period.  You have pain when you urinate.  You develop intestinal problems.  You have pain with sexual intercourse.   This information is not intended to replace advice given to you by your health care provider. Make sure you discuss any questions you have with your health care provider.   Document Released: 12/05/2004 Document Revised: 05/20/2011 Document Reviewed: 08/29/2014 Elsevier Interactive Patient Education 2016 Elsevier Inc. Bacterial Vaginosis Bacterial vaginosis is a vaginal infection that occurs when the normal balance of bacteria in the vagina is disrupted. It results from an overgrowth of certain bacteria. This is the most common vaginal infection in women of childbearing age. Treatment is important to prevent complications, especially in pregnant women, as it can cause a premature delivery. CAUSES  Bacterial vaginosis is caused by an increase in harmful bacteria that are normally present   in smaller amounts in the vagina. Several different kinds of bacteria can cause bacterial vaginosis. However, the reason that the condition develops is not  fully understood. RISK FACTORS Certain activities or behaviors can put you at an increased risk of developing bacterial vaginosis, including:  Having a new sex partner or multiple sex partners.  Douching.  Using an intrauterine device (IUD) for contraception. Women do not get bacterial vaginosis from toilet seats, bedding, swimming pools, or contact with objects around them. SIGNS AND SYMPTOMS  Some women with bacterial vaginosis have no signs or symptoms. Common symptoms include:  Grey vaginal discharge.  A fishlike odor with discharge, especially after sexual intercourse.  Itching or burning of the vagina and vulva.  Burning or pain with urination. DIAGNOSIS  Your health care provider will take a medical history and examine the vagina for signs of bacterial vaginosis. A sample of vaginal fluid may be taken. Your health care provider will look at this sample under a microscope to check for bacteria and abnormal cells. A vaginal pH test may also be done.  TREATMENT  Bacterial vaginosis may be treated with antibiotic medicines. These may be given in the form of a pill or a vaginal cream. A second round of antibiotics may be prescribed if the condition comes back after treatment. Because bacterial vaginosis increases your risk for sexually transmitted diseases, getting treated can help reduce your risk for chlamydia, gonorrhea, HIV, and herpes. HOME CARE INSTRUCTIONS   Only take over-the-counter or prescription medicines as directed by your health care provider.  If antibiotic medicine was prescribed, take it as directed. Make sure you finish it even if you start to feel better.  Tell all sexual partners that you have a vaginal infection. They should see their health care provider and be treated if they have problems, such as a mild rash or itching.  During treatment, it is important that you follow these instructions:  Avoid sexual activity or use condoms correctly.  Do not  douche.  Avoid alcohol as directed by your health care provider.  Avoid breastfeeding as directed by your health care provider. SEEK MEDICAL CARE IF:   Your symptoms are not improving after 3 days of treatment.  You have increased discharge or pain.  You have a fever. MAKE SURE YOU:   Understand these instructions.  Will watch your condition.  Will get help right away if you are not doing well or get worse. FOR MORE INFORMATION  Centers for Disease Control and Prevention, Division of STD Prevention: www.cdc.gov/std American Sexual Health Association (ASHA): www.ashastd.org    This information is not intended to replace advice given to you by your health care provider. Make sure you discuss any questions you have with your health care provider.   Document Released: 02/25/2005 Document Revised: 03/18/2014 Document Reviewed: 10/07/2012 Elsevier Interactive Patient Education 2016 Elsevier Inc.  

## 2015-02-01 NOTE — Progress Notes (Signed)
This chart was scribed for Elvina Sidle, MD by Stann Ore, medical scribe at [facility].The patient was seen in exam room 13 and the patient's care was started at 5:56 PM.  Patient ID: [name] MRN: [id number], DOB: Aug 02, 1985, 29 y.o. Date of Encounter: 02/01/2015  Primary Physician: Nilda Simmer, MD  Chief Complaint:  Chief Complaint  Patient presents with   Vaginitis    last week     HPI:  [name] is a 29 y.o. female who presents to [facility] complaining of vaginitis for a week.  She was here in August for annual physical. She's taken antibiotics and knew she would have some yeast problems. She just wants to make sure if it's bacterial or yeast.   She works as a Engineer, drilling.   Past Medical History  Diagnosis Date   Asthma    Seasonal allergies      Home Meds: Prior to Admission medications   Medication Sig Start Date End Date Taking? Authorizing Provider  acetaminophen (TYLENOL) 325 MG tablet Take 650 mg by mouth every 6 (six) hours as needed for pain.   Yes Historical Provider, MD  etonogestrel (NEXPLANON) 68 MG IMPL implant 1 each by Subdermal route once.   Yes Historical Provider, MD  Phentermine-Topiramate (QSYMIA) 15-92 MG CP24 Take by mouth.   Yes Historical Provider, MD  fluconazole (DIFLUCAN) 150 MG tablet Take 1 tablet (150 mg total) by mouth once. Repeat if needed Patient not taking: Reported on 02/01/2015 10/21/14   Emi Belfast, FNP  metroNIDAZOLE (METROGEL VAGINAL) 0.75 % vaginal gel Place 1 Applicatorful vaginally at bedtime. For 7 days. Patient not taking: Reported on 02/01/2015 10/19/14   Emi Belfast, FNP    Allergies: Not on File  Social History   Social History   Marital Status: Single    Spouse Name: N/A   Number of Children: N/A   Years of Education: N/A   Occupational History   Not on file.   Social History Main Topics   Smoking status: Current Every Day  Smoker   Smokeless tobacco: Not on file   Alcohol Use: 1.0 oz/week    2 drink(s) per week   Drug Use: No   Sexual Activity: Yes    Birth Control/ Protection: Implant   Other Topics Concern   Not on file   Social History Narrative     Review of Systems: Constitutional: negative for fever, chills, night sweats, weight changes, or fatigue  HEENT: negative for vision changes, hearing loss, congestion, rhinorrhea, ST, epistaxis, or sinus pressure Cardiovascular: negative for chest pain or palpitations Respiratory: negative for hemoptysis, wheezing, shortness of breath, or cough Abdominal: negative for abdominal pain, nausea, vomiting, diarrhea, or constipation Dermatological: negative for rash Neurologic: negative for headache, dizziness, or syncope GU: positive for vaginitis All other systems reviewed and are otherwise negative with the exception to those above and in the HPI.  Physical Exam: Blood pressure 120/78, pulse 72, temperature 98.8 F (37.1 C), temperature source Oral, resp. rate 18, height  (1.6 m), weight 188 lb (85.276 kg), SpO2 95 %., Body mass index is 33.31 kg/(m^2). General: Well developed, well nourished, in no acute distress. Head: Normocephalic, atraumatic, eyes without discharge, sclera non-icteric, nares are without discharge. Bilateral auditory canals clear, TM's are without perforation, pearly grey and translucent with reflective cone of light bilaterally. Oral cavity moist, posterior pharynx without exudate, erythema, peritonsillar abscess, or post nasal drip.  Neck: Supple.  No thyromegaly. Full ROM. No lymphadenopathy. Lungs: Clear bilaterally to auscultation without wheezes, rales, or rhonchi. Breathing is unlabored. Heart: RRR with S1 S2. No murmurs, rubs, or gallops appreciated. Msk:  Strength and tone normal for age. Extremities/Skin: Warm and dry. No clubbing or cyanosis. No edema. No rashes or suspicious lesions. Neuro: Alert and oriented X 3.  Moves all extremities spontaneously. Gait is normal. CNII-XII grossly in tact. Psych:  Responds to questions appropriately with a normal affect. GU: creamy white vaginal discharge   Labs: Results for orders placed or performed in visit on 02/01/15  POCT urinalysis dipstick  Result Value Ref Range   Color, UA yellow yellow   Clarity, UA cloudy (A) clear   Glucose, UA negative negative   Bilirubin, UA negative negative   Ketones, POC UA negative negative   Spec Grav, UA 1.015    Blood, UA negative negative   pH, UA 7.5    Protein Ur, POC negative negative   Urobilinogen, UA 0.2    Nitrite, UA Negative Negative   Leukocytes, UA Negative Negative  POCT Microscopic Urinalysis ([facility])  Result Value Ref Range   WBC,UR,HPF,POC Few (A) None WBC/hpf   RBC,UR,HPF,POC None None RBC/hpf   Bacteria Few (A) None, Too numerous to count   Mucus Absent Absent   Epithelial Cells, UR Per Microscopy Few (A) None, Too numerous to count cells/hpf  POCT Wet + KOH Prep  Result Value Ref Range   Yeast by KOH Present Present, Absent   Yeast by wet prep Present Present, Absent   WBC by wet prep Moderate (A) None, Few, Too numerous to count   Clue Cells Wet Prep HPF POC Moderate (A) None, Too numerous to count   Trich by wet prep Absent Present, Absent   Bacteria Wet Prep HPF POC Moderate (A) None, Few, Too numerous to count   Epithelial Cells By [employer]et Pref ([facility]) Many (A) None, Few, Too numerous to count   RBC,UR,HPF,POC None None RBC/hpf     ASSESSMENT AND PLAN:  29 y.o. year old female with  This chart was scribed in my presence and reviewed by me personally.    ICD-9-CM ICD-10-CM   1. Vaginitis and vulvovaginitis 616.10 N76.0 fluconazole (DIFLUCAN) 150 MG tablet     POCT urinalysis dipstick     POCT Microscopic Urinalysis ([facility])     GC/Chlamydia Probe Amp     POCT Wet + KOH Prep     metroNIDAZOLE (FLAGYL) 500 MG tablet     Signed, Elvina SidleKurt Lauenstein, MD    By signing my name below, I,  Stann Oresung-Kai Tsai, attest that this documentation has been prepared under the direction and in the presence of Elvina SidleKurt Lauenstein, MD. Electronically Signed: Stann Oresung-Kai Tsai, Scribe. 02/01/2015 , 5:56 PM .  Signed, Elvina SidleKurt Lauenstein, MD 02/01/2015 5:56 PM

## 2015-02-06 LAB — GC/CHLAMYDIA PROBE AMP
CT Probe RNA: NEGATIVE
GC Probe RNA: NEGATIVE

## 2015-04-27 ENCOUNTER — Telehealth: Payer: Self-pay

## 2015-04-27 NOTE — Telephone Encounter (Signed)
Patient is asking if we do the POS hormone level check??  She also wants to know if she needs to fast - appt made with Leone Payor at 4p   Please call [phone]

## 2015-04-28 NOTE — Telephone Encounter (Signed)
I advised patient come in to have her labs drawn fasting in the morning when we open, the day of her appointment which is 05/17/2015. Patient agreed to do this and return for her appointment with Nurse Practitioner Debbie in the afternoon.

## 2015-04-28 NOTE — Telephone Encounter (Signed)
Can we do this ?

## 2015-05-17 ENCOUNTER — Encounter: Payer: Self-pay | Admitting: Family Medicine

## 2015-05-17 ENCOUNTER — Ambulatory Visit (INDEPENDENT_AMBULATORY_CARE_PROVIDER_SITE_OTHER): Payer: BC Managed Care – PPO | Admitting: Family Medicine

## 2015-05-17 VITALS — BP 110/76 | HR 105 | Temp 99.1°F | Resp 16 | Ht 62.0 in | Wt 175.6 lb

## 2015-05-17 DIAGNOSIS — F17200 Nicotine dependence, unspecified, uncomplicated: Secondary | ICD-10-CM

## 2015-05-17 DIAGNOSIS — L680 Hirsutism: Secondary | ICD-10-CM | POA: Diagnosis not present

## 2015-05-17 DIAGNOSIS — Z77120 Contact with and (suspected) exposure to mold (toxic): Secondary | ICD-10-CM

## 2015-05-17 NOTE — Progress Notes (Signed)
Subjective:    Patient ID: [name] P Koogler, female    DOB: 02/01/1986, 30 y.o.   MRN: [id number]  HPI  This is a pleasant 30 year old female that came in today presenting with possible mold irritation to lungs. She states that on January 31 2015 she started to feel bad and knew that it was mold related because the symptoms only happened at home. She saw the mold yesterday 05/16/2015. Patient had laser hair removal in 2011 but she states that the hair is starting to grow back- wants to have her hormone levels checked. Patient can't tell if the mold has changed her breathing.     Past Medical History  Diagnosis Date  . Asthma   . Seasonal allergies    Family History  Problem Relation Age of Onset  . Cancer Father   . Liver disease Father   . Liver disease Maternal Grandfather    Social History   Social History  . Marital Status: Single    Spouse Name: N/A  . Number of Children: N/A  . Years of Education: N/A   Occupational History  . Not on file.   Social History Main Topics  . Smoking status: Current Every Day Smoker  . Smokeless tobacco: Not on file  . Alcohol Use: 1.0 oz/week    2 drink(s) per week  . Drug Use: No  . Sexual Activity: Yes    Birth Control/ Protection: Implant   Other Topics Concern  . Not on file   Social History Narrative    Review of Systems  Constitutional: Negative for activity change and appetite change.  HENT: Positive for sore throat (right side, intermittent). Negative for rhinorrhea and sinus pressure.   Respiratory: Negative for cough, chest tightness and wheezing.   Cardiovascular: Negative for chest pain.  Gastrointestinal: Negative for vomiting and diarrhea.  Neurological: Positive for dizziness (when in her apartment). Negative for weakness.       Objective:   Physical Exam  Constitutional: She is oriented to person, place, and time. She appears well-developed and well-nourished.  HENT:  Head: Normocephalic.  Neck: Normal  range of motion. No thyromegaly present.  Cardiovascular: Normal rate, regular rhythm and normal heart sounds.   Pulmonary/Chest: Effort normal and breath sounds normal. No respiratory distress. She has no wheezes. She exhibits no tenderness.  Musculoskeletal: Normal range of motion.  Neurological: She is alert and oriented to person, place, and time.  Skin: Skin is warm and dry.  Psychiatric: She has a normal mood and affect. Her behavior is normal. Judgment and thought content normal.      BP 110/76 mmHg  Pulse 105  Temp(Src) 99.1 F (37.3 C) (Oral)  Resp 16  Ht 5\' 2"  (1.575 m)  Wt 175 lb 9.6 oz (79.652 kg)  BMI 32.11 kg/m2  SpO2 97%  LMP 05/13/2015 Wt Readings from Last 3 Encounters:  05/17/15 175 lb 9.6 oz (79.652 kg)  02/01/15 188 lb (85.276 kg)  10/17/14 198 lb 9.6 oz (90.084 kg)   Temp Readings from Last 3 Encounters:  05/17/15 99.1 F (37.3 C) Oral  02/01/15 98.8 F (37.1 C) Oral  10/17/14 98.4 F (36.9 C) Oral   BP Readings from Last 3 Encounters:  05/17/15 110/76  02/01/15 120/78  10/17/14 124/81   Pulse Readings from Last 3 Encounters:  05/17/15 105  02/01/15 72  10/17/14 109       Assessment & Plan:  1. Hormone level balance testosterone levels checked  2. Generalized  mold  -stop smoking - moving options   RTC precautions reviewed  Marlon Pel, FNP-student  [facility], [facility]  05/17/2015 4:30 PM

## 2015-05-17 NOTE — Progress Notes (Signed)
   Subjective:    Patient ID: [name], female    DOB: 08/09/1985, 30 y.o.   MRN: [id number]  HPI This is a pleasant 30 yo female who presents today for evaluation of facial hair growth. She had a series of 14 laser hair treatments and is having regrowth. She would like her hormone levels checked. She has never had a diagnosis of PCOS. She has not had irregular periods. She has not noticed any acne or excessive hair growth anywhere else on her body. She states that the women on both sides of her family have facial hair.   She has had a water leak in her apartment and is concerned that she has been exposed to mold. She has had an intermittent sore throat and may have some allergy symptoms. She smokes several cigarettes a day. She has a history of asthma/allergy. Her apartment is being fixed.   She has been having steady weight loss with Qsymia.   Past Medical History  Diagnosis Date  . Asthma   . Seasonal allergies    No past surgical history on file. Family History  Problem Relation Age of Onset  . Cancer Father   . Liver disease Father   . Liver disease Maternal Grandfather    Social History  Substance Use Topics  . Smoking status: Current Every Day Smoker  . Smokeless tobacco: None  . Alcohol Use: 1.0 oz/week    2 drink(s) per week     Review of Systems Per HPI    Objective:   Physical Exam  Constitutional: She is oriented to person, place, and time. She appears well-developed and well-nourished.  HENT:  Head: Normocephalic and atraumatic.  Right Ear: External ear normal.  Left Ear: External ear normal.  Nose: Nose normal.  Mouth/Throat: Oropharynx is clear and moist. No oropharyngeal exudate.  Eyes: Conjunctivae are normal.  Neck: Normal range of motion. Neck supple.  Cardiovascular: Normal rate.   Pulmonary/Chest: Effort normal.  Musculoskeletal: Normal range of motion.  Neurological: She is alert and oriented to person, place, and time.  Skin: Skin is warm  and dry.  Coarse, dark hair on chin.   Vitals reviewed.     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 She has no other symptoms of PCOS, will check for excessive testosterone - Testosterone - TSH  2. Suspected exposure to mold - discussed lack of testing to check for exposure and discussed her options- moving, symptomatic treatment  3. Tobacco use disorder - encouraged smoking cessation   Olean Reeeborah Gessner, FNP-BC  [facility], [facility]  05/19/2015 5:15 PM

## 2015-05-18 LAB — TSH: TSH: 2.22 mIU/L

## 2015-05-18 LAB — TESTOSTERONE: TESTOSTERONE: 56 ng/dL

## 2015-05-30 ENCOUNTER — Telehealth: Payer: Self-pay

## 2015-05-30 NOTE — Telephone Encounter (Signed)
Pt have labs on the 8th and still hasn't heard from anyone. Please call [phone]

## 2015-05-30 NOTE — Telephone Encounter (Signed)
All labs normal

## 2015-05-30 NOTE — Telephone Encounter (Signed)
Advised on VM  

## 2015-06-01 ENCOUNTER — Telehealth: Payer: Self-pay | Admitting: *Deleted

## 2015-06-01 NOTE — Telephone Encounter (Signed)
Pt was given lab results 

## 2015-10-07 ENCOUNTER — Ambulatory Visit (INDEPENDENT_AMBULATORY_CARE_PROVIDER_SITE_OTHER): Payer: BC Managed Care – PPO | Admitting: Physician Assistant

## 2015-10-07 VITALS — BP 100/64 | HR 97 | Temp 98.9°F | Resp 18 | Ht 63.0 in | Wt 169.2 lb

## 2015-10-07 DIAGNOSIS — B3731 Acute candidiasis of vulva and vagina: Secondary | ICD-10-CM

## 2015-10-07 DIAGNOSIS — N898 Other specified noninflammatory disorders of vagina: Secondary | ICD-10-CM

## 2015-10-07 DIAGNOSIS — B373 Candidiasis of vulva and vagina: Secondary | ICD-10-CM

## 2015-10-07 LAB — POCT WET + KOH PREP: Trich by wet prep: ABSENT

## 2015-10-07 MED ORDER — FLUCONAZOLE 150 MG PO TABS: 150.0000 mg | ORAL_TABLET | Freq: Once | ORAL | 1 refills | Status: AC

## 2015-10-07 NOTE — Patient Instructions (Addendum)
  Motrin 400-800mg  3x/day for 5-10 days for unscheduled bleeding   IF you received an x-ray today, you will receive an invoice from [facility]. Please contact [facility] at [phone] with questions or concerns regarding your invoice.   IF you received labwork today, you will receive an invoice from [employer]. Please contact [facility] at [phone] with questions or concerns regarding your invoice.   Our billing staff will not be able to assist you with questions regarding bills from these companies.  You will be contacted with the lab results as soon as they are available. The fastest way to get your results is to activate your My Chart account. Instructions are located on the last page of this paperwork. If you have not heard from [place] regarding the results in 2 weeks, please contact this office.

## 2015-10-07 NOTE — Progress Notes (Signed)
[name]  MRN: [id number] DOB: 12-30-1985  Subjective:  Pt presents to clinic with vaginal discharge that is thicker and white and their is a bad smell.  She has had problems with irregular lighter menses but longer (14d at a time) since losing weight since the placement of her Nexplanon (she is on her 2nd inplant and about 2.5 years through it) and since then she has had problems with yeast infections.  She is having no urinary symptoms - she is sexually active without change in partner and she is not concerned about STI but she would like to be tested anyways  Had yeast symptoms last month and used monistat and her symptoms resolved.  Review of Systems  Genitourinary: Positive for vaginal discharge. Negative for dysuria, frequency, menstrual problem, pelvic pain, urgency and vaginal bleeding.    Patient Active Problem List   Diagnosis Date Noted  . Obesity (BMI 30-39.9) 09/13/2014    Current Outpatient Prescriptions on File Prior to Visit  Medication Sig Dispense Refill  . acetaminophen (TYLENOL) 325 MG tablet Take 650 mg by mouth every 6 (six) hours as needed for pain.    Marland Kitchen etonogestrel (NEXPLANON) 68 MG IMPL implant 1 each by Subdermal route once.    . Phentermine-Topiramate (QSYMIA) 15-92 MG CP24 Take by mouth.     No current facility-administered medications on file prior to visit.     No Known Allergies  Objective:  BP 100/64   Pulse 97   Temp 98.9 F (37.2 C) (Oral)   Resp 18   Ht 5\' 3"  (1.6 m)   Wt 169 lb 3.2 oz (76.7 kg)   LMP 08/21/2015   SpO2 100%   BMI 29.97 kg/m   Physical Exam  Constitutional: She is oriented to person, place, and time and well-developed, well-nourished, and in no distress.  HENT:  Head: Normocephalic and atraumatic.  Right Ear: Hearing and external ear normal.  Left Ear: Hearing and external ear normal.  Eyes: Conjunctivae are normal.  Neck: Normal range of motion.  Pulmonary/Chest: Effort normal.  Genitourinary: Vagina  normal, cervix normal and vulva normal.  Neurological: She is alert and oriented to person, place, and time. Gait normal.  Skin: Skin is warm and dry.  Psychiatric: Mood, memory, affect and judgment normal.  Vitals reviewed.  Results for orders placed or performed in visit on 10/07/15  POCT Wet + KOH Prep  Result Value Ref Range   Yeast by KOH Present Present, Absent   Yeast by wet prep Present Present, Absent   WBC by wet prep Moderate (A) None, Few, Too numerous to count   Clue Cells Wet Prep HPF POC None None, Too numerous to count   Trich by wet prep Absent Present, Absent   Bacteria Wet Prep HPF POC Moderate (A) None, Few, Too numerous to count   Epithelial Cells By [employer] ([facility]) Few None, Few, Too numerous to count   RBC,UR,HPF,POC None None RBC/hpf    Assessment and Plan :  Vaginal discharge - Plan: POCT Wet + KOH Prep, GC/Chlamydia Probe Amp  Yeast vaginitis - Plan: fluconazole (DIFLUCAN) 150 MG tablet - d/w pt unscented soaps and detergents, cotton underwear no underwear to sleep at night to help decrease yeast infections.  She can try motrin 400-800mg  tid for 5-10d to see if the long menses can be controlled - she has tried OCP in the past for her irregular menses but it did not help  Benny Lennert PA-C  [facility] 10/07/2015 3:25 PM

## 2015-10-10 LAB — GC/CHLAMYDIA PROBE AMP
CT Probe RNA: NOT DETECTED
GC Probe RNA: NOT DETECTED

## 2015-12-30 ENCOUNTER — Ambulatory Visit (INDEPENDENT_AMBULATORY_CARE_PROVIDER_SITE_OTHER): Payer: BC Managed Care – PPO | Admitting: Family Medicine

## 2015-12-30 VITALS — BP 122/78 | HR 110 | Temp 98.3°F | Resp 17 | Ht 63.0 in | Wt 177.0 lb

## 2015-12-30 DIAGNOSIS — J029 Acute pharyngitis, unspecified: Secondary | ICD-10-CM | POA: Diagnosis not present

## 2015-12-30 DIAGNOSIS — J019 Acute sinusitis, unspecified: Secondary | ICD-10-CM | POA: Diagnosis not present

## 2015-12-30 DIAGNOSIS — H6502 Acute serous otitis media, left ear: Secondary | ICD-10-CM | POA: Diagnosis not present

## 2015-12-30 LAB — POCT RAPID STREP A (OFFICE): RAPID STREP A SCREEN: NEGATIVE

## 2015-12-30 MED ORDER — FLUCONAZOLE 150 MG PO TABS: 150.0000 mg | ORAL_TABLET | Freq: Once | ORAL | 0 refills | Status: AC

## 2015-12-30 MED ORDER — AMOXICILLIN 875 MG PO TABS: 875.0000 mg | ORAL_TABLET | Freq: Two times a day (BID) | ORAL | 0 refills | Status: DC

## 2015-12-30 MED ORDER — FLUTICASONE PROPIONATE 50 MCG/ACT NA SUSP: 2.0000 | Freq: Every day | NASAL | 2 refills | Status: DC

## 2015-12-30 NOTE — Progress Notes (Signed)
By signing my name below I, Shelah Lewandowsky, attest that this documentation has been prepared under the direction and in the presence of Norberto Sorenson, MD. Electonically Signed. Shelah Lewandowsky, Scribe 12/30/2015 at 3:15 PM   Subjective:    Patient ID: [name], female    DOB: 20-Sep-1985, 30 y.o.   MRN: [id number]  Chief Complaint  Patient presents with  . Sore Throat  . Nasal Congestion  . Cough    Productive. Started yesterday morning.     HPI [name] is a 30 y.o. female who presents to the [facility] complaining of sore throat, nasal congestion, and cough that has been worsening since symptoms started last night. Cough is productive. Pt also reports chills and mild HA that she has been taking tylenol for with moderate relief. Pt reports that she frequently gets yeast infections when she takes antibiotics.   Patient Active Problem List   Diagnosis Date Noted  . Obesity (BMI 30-39.9) 09/13/2014    Current Outpatient Prescriptions on File Prior to Visit  Medication Sig Dispense Refill  . acetaminophen (TYLENOL) 325 MG tablet Take 650 mg by mouth every 6 (six) hours as needed for pain.    . Dulaglutide (TRULICITY) 1.5 MG/0.5ML SOPN Inject into the skin.    Marland Kitchen etonogestrel (NEXPLANON) 68 MG IMPL implant 1 each by Subdermal route once.    . Phentermine-Topiramate (QSYMIA) 15-92 MG CP24 Take by mouth.     No current facility-administered medications on file prior to visit.     No Known Allergies  Depression screen [facility] 2/9 12/30/2015 10/07/2015 05/17/2015 02/01/2015 10/17/2014  Decreased Interest 0 0 0 0 0  Down, Depressed, Hopeless 0 0 0 0 0  PHQ - 2 Score 0 0 0 0 0       Review of Systems  Constitutional: Positive for chills.  HENT: Positive for congestion and sore throat.   Eyes: Negative for visual disturbance.  Respiratory: Positive for cough (productive).   Gastrointestinal: Negative for abdominal pain.  Endocrine: Negative for polydipsia.    Genitourinary: Negative for dysuria.  Musculoskeletal: Negative for back pain.  Neurological: Positive for headaches.  Psychiatric/Behavioral: Negative for agitation.       Objective:   Physical Exam  Constitutional: She is oriented to person, place, and time. She appears well-developed and well-nourished. No distress.  HENT:  Head: Normocephalic and atraumatic.  Right Ear: Tympanic membrane is not erythematous. A middle ear effusion is present.  Left Ear: Tympanic membrane is erythematous. A middle ear effusion is present.  Nose: Mucosal edema (with erythema) present.  Mouth/Throat: Posterior oropharyngeal erythema present.  Eyes: Conjunctivae are normal. Pupils are equal, round, and reactive to light.  Neck: Neck supple.  Cardiovascular: Regular rhythm and normal heart sounds.  Tachycardia present.  Exam reveals no gallop and no friction rub.   No murmur heard. Pulmonary/Chest: Effort normal and breath sounds normal. No accessory muscle usage. She has no decreased breath sounds. She has no wheezes. She has no rhonchi. She has no rales.  Musculoskeletal: Normal range of motion.  Lymphadenopathy:    She has cervical adenopathy (L>R).  Neurological: She is alert and oriented to person, place, and time.  Skin: Skin is warm. She is diaphoretic.  Psychiatric: She has a normal mood and affect. Her behavior is normal.  Nursing note and vitals reviewed.  BP 122/78 (BP Location: Left Arm, Patient Position: Sitting, Cuff Size: Large)   Pulse (!) 110   Temp 98.3 F (  36.8 C) (Oral)   Resp 17   Ht 5\' 3"  (1.6 m)   Wt 177 lb (80.3 kg)   SpO2 99%   BMI 31.35 kg/m   Results for orders placed or performed in visit on 12/30/15  POCT rapid strep A  Result Value Ref Range   Rapid Strep A Screen Negative Negative         Assessment & Plan:   1. Pharyngitis, unspecified etiology   2. Acute non-recurrent sinusitis, unspecified location   3. Acute serous otitis media of left ear,  recurrence not specified     Orders Placed This Encounter  Procedures  . Culture, Group A Strep    Order Specific Question:   Source    Answer:   oropharynx  . POCT rapid strep A    Meds ordered this encounter  Medications  . amoxicillin (AMOXIL) 875 MG tablet    Sig: Take 1 tablet (875 mg total) by mouth 2 (two) times daily.    Dispense:  20 tablet    Refill:  0  . fluconazole (DIFLUCAN) 150 MG tablet    Sig: Take 1 tablet (150 mg total) by mouth once. After antibiotic course    Dispense:  1 tablet    Refill:  0  . fluticasone (FLONASE) 50 MCG/ACT nasal spray    Sig: Place 2 sprays into both nostrils at bedtime.    Dispense:  16 g    Refill:  2    I personally performed the services described in this documentation, which was scribed in my presence. The recorded information has been reviewed and considered, and addended by me as needed.   Norberto SorensonEva Helmi Hechavarria, M.D.  [facility]   [address] [place] [address] [phone] phone [phone] fax  12/31/15 2:51 AM

## 2015-12-30 NOTE — Patient Instructions (Addendum)
Use the nasal spray at night for at least 2 weeks.    IF you received an x-ray today, you will receive an invoice from [facility]. Please contact [facility] at [phone] with questions or concerns regarding your invoice.   IF you received labwork today, you will receive an invoice from [facility]. Please contact [facility] at [phone] with questions or concerns regarding your invoice.   Our billing staff will not be able to assist you with questions regarding bills from these companies.  You will be contacted with the lab results as soon as they are available. The fastest way to get your results is to activate your My Chart account. Instructions are located on the last page of this paperwork. If you have not heard from us regarding the results in 2 weeks, please contact this office.

## 2016-01-26 ENCOUNTER — Ambulatory Visit (INDEPENDENT_AMBULATORY_CARE_PROVIDER_SITE_OTHER): Payer: BC Managed Care – PPO | Admitting: Physician Assistant

## 2016-01-26 VITALS — BP 108/62 | HR 100 | Temp 98.2°F | Resp 16 | Ht 63.0 in | Wt 175.0 lb

## 2016-01-26 DIAGNOSIS — N898 Other specified noninflammatory disorders of vagina: Secondary | ICD-10-CM

## 2016-01-26 DIAGNOSIS — Z113 Encounter for screening for infections with a predominantly sexual mode of transmission: Secondary | ICD-10-CM | POA: Diagnosis not present

## 2016-01-26 DIAGNOSIS — B3731 Acute candidiasis of vulva and vagina: Secondary | ICD-10-CM

## 2016-01-26 DIAGNOSIS — B373 Candidiasis of vulva and vagina: Secondary | ICD-10-CM

## 2016-01-26 LAB — POCT WET + KOH PREP: TRICH BY WET PREP: ABSENT

## 2016-01-26 MED ORDER — FLUCONAZOLE 150 MG PO TABS: 150.0000 mg | ORAL_TABLET | Freq: Once | ORAL | 0 refills | Status: AC

## 2016-01-26 NOTE — Patient Instructions (Signed)
     IF you received an x-ray today, you will receive an invoice from [facility]. Please contact [facility] at [phone] with questions or concerns regarding your invoice.   IF you received labwork today, you will receive an invoice from [facility]. Please contact [facility] at [phone] with questions or concerns regarding your invoice.   Our billing staff will not be able to assist you with questions regarding bills from these companies.  You will be contacted with the lab results as soon as they are available. The fastest way to get your results is to activate your My Chart account. Instructions are located on the last page of this paperwork. If you have not heard from us regarding the results in 2 weeks, please contact this office.      

## 2016-01-26 NOTE — Progress Notes (Signed)
Patient ID: [name], female    DOB: 11/06/1985, 30 y.o.   MRN: [id number]  PCP: Nilda SimmerSMITH,KRISTI, MD  Chief Complaint  Patient presents with  . Vaginal Itching    Reddness  . Vaginal Discharge    White, clumpy     Subjective:   Presents for evaluation of vaginal itching, redness and white clumpy discharge. She believes that she has a yeast infection. Also desires STI screening.  Symptoms began 3 days ago, following a course of amoxicillin to treat a URI. She took a dose of fluconazole on the last day of the antibiotic course as prophylaxis against vaginal yeast.  She is in a monogamous relationship with a female partner x 12 months. GC/CT negative 09/2015. HIV, RPR, GC/CT negative 10/2014.  Nexplanon for contraception. No condoms. LMP 6 weeks ago. Menses are approximately Q8 weeks.  No urinary urgency, frequency or burning. No hematuria. No back pain, abdominal pain. No nausea, vomiting or diarrhea. No fever/chills.    Review of Systems  As above.   Patient Active Problem List   Diagnosis Date Noted  . Obesity (BMI 30-39.9) 09/13/2014     Prior to Admission medications   Medication Sig Start Date End Date Taking? Authorizing Provider  acetaminophen (TYLENOL) 325 MG tablet Take 650 mg by mouth every 6 (six) hours as needed for pain.   Yes Historical Provider, MD  Dulaglutide (TRULICITY) 1.5 MG/0.5ML SOPN Inject into the skin.   Yes Historical Provider, MD  etonogestrel (NEXPLANON) 68 MG IMPL implant 1 each by Subdermal route once.   Yes Historical Provider, MD  fluticasone (FLONASE) 50 MCG/ACT nasal spray Place 2 sprays into both nostrils at bedtime. 12/30/15  Yes Sherren MochaEva N Shaw, MD  Phentermine-Topiramate (QSYMIA) 15-92 MG CP24 Take by mouth.   Yes Historical Provider, MD     No Known Allergies     Objective:  Physical Exam  Constitutional: She is oriented to person, place, and time. She appears well-developed and well-nourished. She is active and cooperative. No  distress.  BP 108/62   Pulse 100   Temp 98.2 F (36.8 C) (Oral)   Resp 16   Ht 5\' 3"  (1.6 m)   Wt 175 lb (79.4 kg)   SpO2 99%   BMI 31.00 kg/m    Eyes: Conjunctivae are normal.  Pulmonary/Chest: Effort normal.  Abdominal: Hernia confirmed negative in the right inguinal area and confirmed negative in the left inguinal area.  Genitourinary: Uterus normal. Pelvic exam was performed with patient supine. No labial fusion. There is no rash, tenderness, lesion or injury on the right labia. There is no rash, tenderness, lesion or injury on the left labia. Cervix exhibits no motion tenderness, no discharge and no friability. Right adnexum displays no mass, no tenderness and no fullness. Left adnexum displays no mass, no tenderness and no fullness. No erythema, tenderness or bleeding in the vagina. No foreign body in the vagina. No signs of injury around the vagina. Vaginal discharge found.  Lymphadenopathy:       Right: No inguinal adenopathy present.       Left: No inguinal adenopathy present.  Neurological: She is alert and oriented to person, place, and time.  Psychiatric: She has a normal mood and affect. Her speech is normal and behavior is normal.      Results for orders placed or performed in visit on 01/26/16  POCT Wet + KOH Prep  Result Value Ref Range   Yeast by KOH Present Present, Absent  Yeast by wet prep Present Present, Absent   WBC by wet prep Few None, Few, Too numerous to count   Clue Cells Wet Prep HPF POC None None, Too numerous to count   Trich by wet prep Absent Present, Absent   Bacteria Wet Prep HPF POC Moderate (A) None, Few, Too numerous to count   Epithelial Cells By [employer] ([facility]) Few None, Few, Too numerous to count   RBC,UR,HPF,POC None None RBC/hpf        Assessment & Plan:   1. Vaginal discharge - POCT Wet + KOH Prep - GC/Chlamydia Probe Amp - SureSwab, T.vaginalis RNA,Ql,Female  2. Routine screening for STI (sexually transmitted infection) -  HIV antibody - RPR - Hepatitis B surface antibody - Hepatitis B surface antigen - Hepatitis C antibody - SureSwab, T.vaginalis RNA,Ql,Female  3. Vaginal yeast infection - fluconazole (DIFLUCAN) 150 MG tablet; Take 1 tablet (150 mg total) by mouth once. Repeat if needed  Dispense: 2 tablet; Refill: 0   Fernande Brashelle S. Valdis Bevill, PA-C Physician Assistant-Certified [facility]

## 2016-01-26 NOTE — Progress Notes (Signed)
Subjective:    Patient ID: [name], female    DOB: 01/07/1986, 30 y.o.   MRN: [id number]  Chief Complaint  Patient presents with  . Vaginal Itching    Reddness  . Vaginal Discharge    White, clumpy    HPI: Present for vaginal itching, irritation, and white, clumpy non-odorous vaginal discharge which began 3 days ago. Associated symptoms include discomfort and redness in the vaginal area from persistent itching. Patient finished course of Amoxicillin a little over 2 weeks ago for acute URI. She was given prophylactic Diflucan which she took on the last day of the course of antibiotic. She notes a history of yeast infections when taking antibiotics. Has Nexplanon implant for contraception, LMP was 1.5 months ago, states she usually get a menstrual cycle every 2 months. She has had the same female partner for 1 year, denies use of condoms or other protection other than Nexplanon. Interested in STD check as well. Denies dysuria, urinary frequency or urgency, hematuria, abdominal pain, pelvic pain, fevers, chills, nausea or vomiting.     URI symptoms have resolved since completion of Amoxicillin course. Currently on Dulaglutide and Phentermine-Topiramate for weight loss.   States she will receive her flu vaccine at work.  Review of Systems Pertinent ROS mentioned above in HPI.  No Known Allergies  Prior to Admission medications   Medication Sig Start Date End Date Taking? Authorizing Provider  acetaminophen (TYLENOL) 325 MG tablet Take 650 mg by mouth every 6 (six) hours as needed for pain.   Yes Historical Provider, MD  Dulaglutide (TRULICITY) 1.5 MG/0.5ML SOPN Inject into the skin.   Yes Historical Provider, MD  etonogestrel (NEXPLANON) 68 MG IMPL implant 1 each by Subdermal route once.   Yes Historical Provider, MD  fluticasone (FLONASE) 50 MCG/ACT nasal spray Place 2 sprays into both nostrils at bedtime. 12/30/15  Yes Sherren MochaEva N Shaw, MD  Phentermine-Topiramate (QSYMIA) 15-92 MG CP24  Take by mouth.   Yes Historical Provider, MD   Patient Active Problem List   Diagnosis Date Noted  . Obesity (BMI 30-39.9) 09/13/2014       Objective: Blood pressure 108/62, pulse 100, temperature 98.2 F (36.8 C), temperature source Oral, resp. rate 16, height 5\' 3"  (1.6 m), weight 175 lb (79.4 kg), SpO2 99 %.   Physical Exam  Constitutional: She is oriented to person, place, and time. She appears well-developed and well-nourished. No distress.  HENT:  Head: Normocephalic and atraumatic.  Eyes: Conjunctivae are normal. Pupils are equal, round, and reactive to light. Right eye exhibits no discharge. Left eye exhibits no discharge. No scleral icterus.  Neck: No tracheal deviation present. No thyromegaly present.  Cardiovascular: Normal rate, regular rhythm and normal heart sounds.  Exam reveals no gallop and no friction rub.   No murmur heard. Pulmonary/Chest: Effort normal and breath sounds normal. No stridor. No respiratory distress. She has no wheezes. She has no rales.  Genitourinary:    There is no rash, tenderness or lesion on the right labia. There is no rash, tenderness or lesion on the left labia. Uterus is not enlarged and not tender. Cervix exhibits discharge. Cervix exhibits no motion tenderness. Right adnexum displays no mass, no tenderness and no fullness. Left adnexum displays no mass, no tenderness and no fullness. No erythema, tenderness or bleeding in the vagina. Vaginal discharge found.    Lymphadenopathy:    She has no cervical adenopathy.  Neurological: She is alert and oriented to person, place, and time.  Skin: Skin is warm and dry. No rash noted. She is not diaphoretic. No erythema.  Psychiatric: She has a normal mood and affect. Her behavior is normal.   Results for orders placed or performed in visit on 01/26/16  POCT Wet + KOH Prep  Result Value Ref Range   Yeast by KOH Present Present, Absent   Yeast by wet prep Present Present, Absent   WBC by wet prep  Few None, Few, Too numerous to count   Clue Cells Wet Prep HPF POC None None, Too numerous to count   Trich by wet prep Absent Present, Absent   Bacteria Wet Prep HPF POC Moderate (A) None, Few, Too numerous to count   Epithelial Cells By [employer] ([employer]) Few None, Few, Too numerous to count   RBC,UR,HPF,POC None None RBC/hpf       Assessment & Plan:  1. Vaginal discharge Wet prep demonstrates moderate yeast and bacteria, suspect yeast infection. GC/Chlamydia and Trichomonas pending, will updated patient upon review of results. - POCT Wet + KOH Prep - GC/Chlamydia Probe Amp - SureSwab, T.vaginalis RNA,Ql,Female  2. Routine screening for STI (sexually transmitted infection) HIV, RPR, Hep. B/C, Trichomonas pending, will update patient upon review of results. - HIV antibody - RPR - Hepatitis B surface antibody - Hepatitis B surface antigen - Hepatitis C antibody - SureSwab, T.vaginalis RNA,Ql,Female  3. Vaginal yeast infection Rx Diflucan, instructed to take 1 tablet today and another in 48-72 hours in symptoms persist. RTC if symptoms are not resolving or worsening. - fluconazole (DIFLUCAN) 150 MG tablet; Take 1 tablet (150 mg total) by mouth once. Repeat if needed  Dispense: 2 tablet; Refill: 0

## 2016-01-27 LAB — GC/CHLAMYDIA PROBE AMP
CT Probe RNA: NOT DETECTED
GC PROBE AMP APTIMA: NOT DETECTED

## 2016-01-27 LAB — HEPATITIS B SURFACE ANTIBODY, QUANTITATIVE

## 2016-01-27 LAB — HIV ANTIBODY (ROUTINE TESTING W REFLEX): HIV: NONREACTIVE

## 2016-01-27 LAB — TRICHOMONAS VAGINALIS RNA, QL,MALES: TRICHOMONAS VAGINALIS RNA: NOT DETECTED

## 2016-01-27 LAB — HEPATITIS C ANTIBODY: HCV AB: NEGATIVE

## 2016-01-27 LAB — HEPATITIS B SURFACE ANTIGEN: Hepatitis B Surface Ag: NEGATIVE

## 2016-01-27 LAB — RPR

## 2016-06-07 ENCOUNTER — Ambulatory Visit (INDEPENDENT_AMBULATORY_CARE_PROVIDER_SITE_OTHER): Payer: BC Managed Care – PPO | Admitting: Physician Assistant

## 2016-06-07 VITALS — BP 116/76 | HR 93 | Temp 98.4°F | Resp 17 | Ht 63.0 in | Wt 191.0 lb

## 2016-06-07 DIAGNOSIS — J039 Acute tonsillitis, unspecified: Secondary | ICD-10-CM

## 2016-06-07 DIAGNOSIS — J029 Acute pharyngitis, unspecified: Secondary | ICD-10-CM

## 2016-06-07 DIAGNOSIS — Z299 Encounter for prophylactic measures, unspecified: Secondary | ICD-10-CM

## 2016-06-07 LAB — POCT RAPID STREP A (OFFICE): Rapid Strep A Screen: NEGATIVE

## 2016-06-07 MED ORDER — AMOXICILLIN 875 MG PO TABS: 875.0000 mg | ORAL_TABLET | Freq: Two times a day (BID) | ORAL | 0 refills | Status: DC

## 2016-06-07 MED ORDER — FLUCONAZOLE 150 MG PO TABS: 150.0000 mg | ORAL_TABLET | Freq: Once | ORAL | 0 refills | Status: AC

## 2016-06-07 NOTE — Patient Instructions (Addendum)
Effective can be 600 mg Ibuprofen every 6-8 hours.  Tylenol 1000 mg every eight hours.     IF you received an x-ray today, you will receive an invoice from [facility]. Please contact [facility] at [phone] with questions or concerns regarding your invoice.   IF you received labwork today, you will receive an invoice from [facility]. Please contact [facility] at [phone] with questions or concerns regarding your invoice.   Our billing staff will not be able to assist you with questions regarding bills from these companies.  You will be contacted with the lab results as soon as they are available. The fastest way to get your results is to activate your My Chart account. Instructions are located on the last page of this paperwork. If you have not heard from [place] regarding the results in 2 weeks, please contact this office.

## 2016-06-07 NOTE — Progress Notes (Addendum)
06/07/2016 10:08 AM   DOB: 09-Feb-1986 / MRN: [id number]  SUBJECTIVE:  [name] is a 31 y.o. female presenting for sore throat. This started two days ago. She assoicates some ear pressure and cough productive of blood speckled mucous, and nasal congestion.   She has tried mucinex mdx on Wednesday and this did help transiently.  Denies fever, chills, myalgia.   She request diflucan if she needs to take abx.   She has No Known Allergies.   She  has a past medical history of Asthma and Seasonal allergies.    She  reports that she has been smoking Cigarettes.  She has a 2.00 pack-year smoking history. She has never used smokeless tobacco. She reports that she drinks about 2.4 oz of alcohol per week . She reports that she does not use drugs. She  reports that she currently engages in sexual activity. She reports using the following method of birth control/protection: Implant. The patient  has no past surgical history on file.  Her family history includes Cancer in her father; Liver disease in her father and maternal grandfather.  Review of Systems  Constitutional: Negative for chills, diaphoresis and fever.  Respiratory: Positive for hemoptysis. Negative for cough, sputum production, shortness of breath and wheezing.   Cardiovascular: Negative for chest pain, orthopnea and leg swelling.  Gastrointestinal: Negative for nausea.  Neurological: Negative for dizziness.    The problem list and medications were reviewed and updated by myself where necessary and exist elsewhere in the encounter.   OBJECTIVE:  BP 116/76   Pulse 93   Temp 98.4 F (36.9 C) (Oral)   Resp 17   Ht  (1.6 m)   Wt 191 lb (86.6 kg)   SpO2 99%   BMI 33.83 kg/m   Physical Exam  HENT:  Right Ear: Hearing, tympanic membrane, external ear and ear canal normal.  Left Ear: Hearing, tympanic membrane, external ear and ear canal normal.  Nose: Nose normal. Right sinus exhibits no maxillary sinus tenderness and no  frontal sinus tenderness. Left sinus exhibits no maxillary sinus tenderness and no frontal sinus tenderness.  Mouth/Throat: Uvula is midline and mucous membranes are normal. Mucous membranes are not dry. Oropharyngeal exudate present. No posterior oropharyngeal edema or tonsillar abscesses.  Cardiovascular: Normal rate, regular rhythm, S1 normal and S2 normal.  Exam reveals no gallop, no friction rub and no decreased pulses.   No murmur heard. Pulmonary/Chest: Effort normal. No stridor. No respiratory distress. She has no wheezes. She has no rales.  Abdominal: She exhibits no distension.  Musculoskeletal: She exhibits no edema.  Lymphadenopathy:       Head (right side): Tonsillar adenopathy present. No submandibular adenopathy present.       Head (left side): Tonsillar adenopathy present. No submandibular adenopathy present.    She has no cervical adenopathy.    Pulse Readings from Last 3 Encounters:  06/07/16 93  01/26/16 100  12/30/15 (!) 110     Results for orders placed or performed in visit on 06/07/16 (from the past 72 hour(s))  Rapid Strep A     Status: None   Collection Time: 06/07/16  9:47 AM  Result Value Ref Range   Rapid Strep A Screen Negative Negative    No results found.  ASSESSMENT AND PLAN:  [name] was seen today for sore throat.  Diagnoses and all orders for this visit:  Sore throat: she has tonsillar exudate and tonsillar tenderness.  Will go ahead and treat regardless of  rapid and await culture results.   -     Rapid Strep A  Exudative tonsillitis -     Cancel: Rapid Strep A -     Culture, Group A Strep -     amoxicillin (AMOXIL) 875 MG tablet; Take 1 tablet (875 mg total) by mouth 2 (two) times daily.  Need for prophylactic measure -     fluconazole (DIFLUCAN) 150 MG tablet; Take 1 tablet (150 mg total) by mouth once. Repeat if needed    The patient is advised to call or return to clinic if she does not see an improvement in symptoms, or to seek the  care of the closest emergency department if she worsens with the above plan.   Deliah Boston, MHS, PA-C [facility] 06/07/2016 10:08 AM

## 2016-06-07 NOTE — Progress Notes (Signed)
Pt advised of neg rapid strep Cont meds and we will call with final lab cx

## 2016-06-10 ENCOUNTER — Other Ambulatory Visit: Payer: Self-pay | Admitting: Physician Assistant

## 2016-06-10 ENCOUNTER — Encounter: Payer: Self-pay | Admitting: Physician Assistant

## 2016-06-10 LAB — CULTURE, GROUP A STREP

## 2017-01-10 NOTE — Progress Notes (Signed)
Subjective:    Patient ID: [name], female    DOB: 04/21/1985, 31 y.o.   MRN: [id number]  Chief Complaint  Patient presents with  . Pap smear    & STI Check, Per pt no symptoms    HPI Primary Preventative Screenings: Cervical Cancer: Pap 10/17/2014 - repeat in 3 years Family Planning: [facility] - [facility] told it was absolutely fine to keep for 4 yrs - would be just as effective, no condoms. Menses ~q8 wks. STI screening: all neg last yr and annually. HIV neg annually - last 01/2016 Breast Cancer: NA due to age Colorectal Cancer: NA due to age Tobacco use/EtOH/substances: Bone Density: Cardiac: Weight/Blood sugar/Diet/Exercise: BMI Readings from Last 3 Encounters:  06/07/16 33.83 kg/m  01/26/16 31.00 kg/m  12/30/15 31.35 kg/m   Lab Results  Component Value Date   HGBA1C 5.6 10/17/2014   OTC/Vit/Supp/Herbal: Dentist/Optho: Immunizations: Was not immune to Hep B last yr - neg surf ab 01/2016  There is no immunization history on file for this patient.   Chronic Medical Conditions: No complaints or concerns. No exposures. Here today for her pap exam and annual STD testing  Past Medical History:  Diagnosis Date  . Asthma   . Seasonal allergies    No past surgical history on file. Current Outpatient Prescriptions on File Prior to Visit  Medication Sig Dispense Refill  . etonogestrel (NEXPLANON) 68 MG IMPL implant 1 each by Subdermal route once.    Marland Kitchen. acetaminophen (TYLENOL) 325 MG tablet Take 650 mg by mouth every 6 (six) hours as needed for pain.    Marland Kitchen. amoxicillin (AMOXIL) 875 MG tablet Take 1 tablet (875 mg total) by mouth 2 (two) times daily. (Patient not taking: Reported on 01/11/2017) 20 tablet 0  . Dulaglutide (TRULICITY) 1.5 MG/0.5ML SOPN Inject into the skin.    . fluticasone (FLONASE) 50 MCG/ACT nasal spray Place 2 sprays into both nostrils at bedtime. (Patient not taking: Reported on 01/11/2017) 16 g 2  .  Phentermine-Topiramate (QSYMIA) 15-92 MG CP24 Take by mouth.     No current facility-administered medications on file prior to visit.    No Known Allergies Family History  Problem Relation Age of Onset  . Cancer Father        prostate   . Liver disease Father   . Liver disease Maternal Grandfather    Social History   Social History  . Marital status: Single    Spouse name: N/A  . Number of children: N/A  . Years of education: N/A   Social History Main Topics  . Smoking status: Current Every Day Smoker    Packs/day: 0.50    Years: 4.00    Types: Cigarettes  . Smokeless tobacco: Never Used  . Alcohol use 2.4 oz/week    2 Standard drinks or equivalent, 2 Glasses of wine per week  . Drug use: No  . Sexual activity: Yes    Birth control/ protection: Implant   Other Topics Concern  . None   Social History Narrative  . None   Dep[facility] screen [facility] 2/9 01/11/2017 06/07/2016 12/30/2015 10/07/2015 05/17/2015  Decreased Interest 0 0 0 0 0  Down, Depressed, Hopeless 0 0 0 0 0  PHQ - 2 Score 0 0 0 0 0    Review of Systems  Constitutional: Negative for activity change, appetite change, chills, diaphoresis, fatigue, fever and unexpected weight change.  Gastrointestinal: Negative for abdominal pain, anal bleeding, blood in  stool, constipation, diarrhea and rectal pain.  Genitourinary: Negative for decreased urine volume, difficulty urinating, dyspareunia, dysuria, frequency, genital sores, hematuria, menstrual problem, pelvic pain, urgency, vaginal bleeding, vaginal discharge and vaginal pain.  Musculoskeletal: Negative for gait problem.  Skin: Negative for rash.  Hematological: Negative for adenopathy.  Psychiatric/Behavioral: The patient is not nervous/anxious.        Objective:   Physical Exam  Constitutional: She is oriented to person, place, and time. She appears well-developed and well-nourished. No distress.  HENT:  Head: Normocephalic and atraumatic.  Right Ear:  External ear normal.  Eyes: Conjunctivae are normal. No scleral icterus.  Cardiovascular: Normal rate, regular rhythm, normal heart sounds and intact distal pulses.   Pulmonary/Chest: Effort normal and breath sounds normal.  Abdominal: Soft. Bowel sounds are normal. She exhibits no distension. There is no tenderness. There is no rebound and no guarding.  Genitourinary: Uterus normal. Pelvic exam was performed with patient supine. There is no rash, tenderness or lesion on the right labia. There is no rash, tenderness or lesion on the left labia. Cervix exhibits no motion tenderness and no friability. Right adnexum displays no mass, no tenderness and no fullness. Left adnexum displays no mass, no tenderness and no fullness. No erythema or tenderness in the vagina. No vaginal discharge found.  Lymphadenopathy:       Right: No inguinal adenopathy present.       Left: No inguinal adenopathy present.  Neurological: She is alert and oriented to person, place, and time.  Skin: Skin is warm and dry. She is not diaphoretic. No erythema.  Psychiatric: She has a normal mood and affect. Her behavior is normal.    BP 110/70   Pulse 94   Temp 98.3 F (36.8 C) (Oral)   Resp 16   Ht 5\' 3"  (1.6 m)   Wt 215 lb (97.5 kg)   LMP 12/14/2016   SpO2 100%   BMI 38.09 kg/m      Assessment & Plan:   1. Routine screening for STI (sexually transmitted infection)   2. Screening for cervical cancer   3. Family planning - [facility] - rec she have it replaced (she was told it could last 4 yrs) and she does NOT want it replaced at same visit - would like to wait sev wks between removal and reinsertion which is what she did prior. Go ahead and schedule appt to have removed here and then we can get a PA for a new nexplanon started at that visit for insertion sev wks later per pt preference.  4. 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 #1 given today RTC 2 mos for second    Orders Placed This Encounter  Procedures  . Hepatitis B vaccine adult IM    #1  . Hepatitis B vaccine adult IM    #2    Standing Status:   Future    Standing Expiration Date:   07/12/2018  . Hepatitis B vaccine adult IM    #3    Standing Status:   Future    Standing Expiration Date:   07/12/2018  . HIV antibody  . HCV Ab w/Rflx to Verification  . RPR  . POCT Wet + KOH Prep  . POCT urinalysis dipstick  . POCT Microscopic Urinalysis ([facility])    Carley Hammed  Clelia Croft, M.D.  Primary Care at [facility] [address] [place], [place] [address] [phone] phone [phone] fax  01/11/17 5:20 PM

## 2017-01-11 ENCOUNTER — Ambulatory Visit (INDEPENDENT_AMBULATORY_CARE_PROVIDER_SITE_OTHER): Payer: BC Managed Care – PPO | Admitting: Family Medicine

## 2017-01-11 ENCOUNTER — Encounter: Payer: Self-pay | Admitting: Family Medicine

## 2017-01-11 VITALS — BP 110/70 | HR 94 | Temp 98.3°F | Resp 16 | Ht 63.0 in | Wt 215.0 lb

## 2017-01-11 DIAGNOSIS — Z3009 Encounter for other general counseling and advice on contraception: Secondary | ICD-10-CM

## 2017-01-11 DIAGNOSIS — Z124 Encounter for screening for malignant neoplasm of cervix: Secondary | ICD-10-CM

## 2017-01-11 DIAGNOSIS — Z23 Encounter for immunization: Secondary | ICD-10-CM | POA: Diagnosis not present

## 2017-01-11 DIAGNOSIS — Z113 Encounter for screening for infections with a predominantly sexual mode of transmission: Secondary | ICD-10-CM | POA: Diagnosis not present

## 2017-01-11 LAB — POCT URINALYSIS DIP (MANUAL ENTRY)
Bilirubin, UA: NEGATIVE
Glucose, UA: NEGATIVE mg/dL
Ketones, POC UA: NEGATIVE mg/dL
Leukocytes, UA: NEGATIVE
NITRITE UA: NEGATIVE
PH UA: 5.5 (ref 5.0–8.0)
PROTEIN UA: NEGATIVE mg/dL
RBC UA: NEGATIVE
UROBILINOGEN UA: 0.2 U/dL

## 2017-01-11 LAB — POCT WET + KOH PREP
Trich by wet prep: ABSENT
Yeast by KOH: ABSENT
Yeast by wet prep: ABSENT

## 2017-01-11 LAB — POC MICROSCOPIC URINALYSIS (UMFC): Mucus: ABSENT

## 2017-01-11 NOTE — Patient Instructions (Addendum)
   IF you received an x-ray today, you will receive an invoice from [facility]. Please contact  Radiology at [phone] with questions or concerns regarding your invoice.   IF you received labwork today, you will receive an invoice from [facility]. Please contact [facility] at [phone] with questions or concerns regarding your invoice.   Our billing staff will not be able to assist you with questions regarding bills from these companies.  You will be contacted with the lab results as soon as they are available. The fastest way to get your results is to activate your My Chart account. Instructions are located on the last page of this paperwork. If you have not heard from us regarding the results in 2 weeks, please contact this office.     Sexually Transmitted Disease A sexually transmitted disease (STD) is a disease or infection that may be passed (transmitted) from person to person, usually during sexual activity. This may happen by way of saliva, semen, blood, vaginal mucus, or urine. Common STDs include:  Gonorrhea.  Chlamydia.  Syphilis.  HIV and AIDS.  Genital herpes.  Hepatitis B and C.  Trichomonas.  Human papillomavirus (HPV).  Pubic lice.  Scabies.  Mites.  Bacterial vaginosis.  What are the causes? An STD may be caused by bacteria, a virus, or parasites. STDs are often transmitted during sexual activity if one person is infected. However, they may also be transmitted through nonsexual means. STDs may be transmitted after:  Sexual intercourse with an infected person.  Sharing sex toys with an infected person.  Sharing needles with an infected person or using unclean piercing or tattoo needles.  Having intimate contact with the genitals, mouth, or rectal areas of an infected person.  Exposure to infected fluids during birth.  What are the signs or symptoms? Different STDs have different symptoms. Some people may not have any  symptoms. If symptoms are present, they may include:  Painful or bloody urination.  Pain in the pelvis, abdomen, vagina, anus, throat, or eyes.  A skin rash, itching, or irritation.  Growths, ulcerations, blisters, or sores in the genital and anal areas.  Abnormal vaginal discharge with or without bad odor.  Penile discharge in men.  Fever.  Pain or bleeding during sexual intercourse.  Swollen glands in the groin area.  Yellow skin and eyes (jaundice). This is seen with hepatitis.  Swollen testicles.  Infertility.  Sores and blisters in the mouth.  How is this diagnosed? To make a diagnosis, your health care provider may:  Take a medical history.  Perform a physical exam.  Take a sample of any discharge to examine.  Swab the throat, cervix, opening to the penis, rectum, or vagina for testing.  Test a sample of your first morning urine.  Perform blood tests.  Perform a Pap test, if this applies.  Perform a colposcopy.  Perform a laparoscopy.  How is this treated? Treatment depends on the STD. Some STDs may be treated but not cured.  Chlamydia, gonorrhea, trichomonas, and syphilis can be cured with antibiotic medicine.  Genital herpes, hepatitis, and HIV can be treated, but not cured, with prescribed medicines. The medicines lessen symptoms.  Genital warts from HPV can be treated with medicine or by freezing, burning (electrocautery), or surgery. Warts may come back.  HPV cannot be cured with medicine or surgery. However, abnormal areas may be removed from the cervix, vagina, or vulva.  If your diagnosis is confirmed, your recent sexual partners need treatment. This is   true even if they are symptom-free or have a negative culture or evaluation. They should not have sex until their health care providers say it is okay.  Your health care provider may test you for infection again 3 months after treatment.  How is this prevented? Take these steps to reduce  your risk of getting an STD:  Use latex condoms, dental dams, and water-soluble lubricants during sexual activity. Do not use petroleum jelly or oils.  Avoid having multiple sex partners.  Do not have sex with someone who has other sex partners.  Do not have sex with anyone you do not know or who is at high risk for an STD.  Avoid risky sex practices that can break your skin.  Do not have sex if you have open sores on your mouth or skin.  Avoid drinking too much alcohol or taking illegal drugs. Alcohol and drugs can affect your judgment and put you in a vulnerable position.  Avoid engaging in oral and anal sex acts.  Get vaccinated for HPV and hepatitis. If you have not received these vaccines in the past, talk to your health care provider about whether one or both might be right for you.  If you are at risk of being infected with HIV, it is recommended that you take a prescription medicine daily to prevent HIV infection. This is called pre-exposure prophylaxis (PrEP). You are considered at risk if: ? You are a man who has sex with other men (MSM). ? You are a heterosexual man or woman and are sexually active with more than one partner. ? You take drugs by injection. ? You are sexually active with a partner who has HIV.  Talk with your health care provider about whether you are at high risk of being infected with HIV. If you choose to begin PrEP, you should first be tested for HIV. You should then be tested every 3 months for as long as you are taking PrEP.  Contact a health care provider if:  See your health care provider.  Tell your sexual partner(s). They should be tested and treated for any STDs.  Do not have sex until your health care provider says it is okay. Get help right away if: Contact your health care provider right away if:  You have severe abdominal pain.  You are a man and notice swelling or pain in your testicles.  You are a woman and notice swelling or pain  in your vagina.  This information is not intended to replace advice given to you by your health care provider. Make sure you discuss any questions you have with your health care provider. Document Released: 05/18/2002 Document Revised: 09/15/2015 Document Reviewed: 09/15/2012 Elsevier Interactive Patient Education  2018 Elsevier Inc.  

## 2017-01-13 LAB — HCV AB W/RFLX TO VERIFICATION

## 2017-01-13 LAB — HIV ANTIBODY (ROUTINE TESTING W REFLEX): HIV Screen 4th Generation wRfx: NONREACTIVE

## 2017-01-13 LAB — HCV INTERPRETATION

## 2017-01-13 LAB — RPR: RPR: NONREACTIVE

## 2017-01-15 LAB — PAP IG, CT-NG, RFX HPV ASCU
CHLAMYDIA, NUC. ACID AMP: NEGATIVE
GONOCOCCUS BY NUCLEIC ACID AMP: NEGATIVE
PAP Smear Comment: 0

## 2017-03-10 ENCOUNTER — Ambulatory Visit (INDEPENDENT_AMBULATORY_CARE_PROVIDER_SITE_OTHER): Payer: BC Managed Care – PPO | Admitting: Emergency Medicine

## 2017-03-10 DIAGNOSIS — Z23 Encounter for immunization: Secondary | ICD-10-CM | POA: Diagnosis not present

## 2017-03-10 NOTE — Patient Instructions (Signed)
Please return by 07/09/17 for your third and final Hep B Vaccine.

## 2017-03-10 NOTE — Progress Notes (Signed)
Second hep b vaccination.

## 2017-04-11 ENCOUNTER — Encounter: Payer: Self-pay | Admitting: Family Medicine

## 2017-04-11 ENCOUNTER — Ambulatory Visit: Payer: BC Managed Care – PPO | Admitting: Family Medicine

## 2017-04-11 VITALS — BP 110/78 | HR 82 | Temp 97.7°F | Resp 16 | Ht 63.0 in | Wt 216.6 lb

## 2017-04-11 DIAGNOSIS — Z3009 Encounter for other general counseling and advice on contraception: Secondary | ICD-10-CM

## 2017-04-11 DIAGNOSIS — F17200 Nicotine dependence, unspecified, uncomplicated: Secondary | ICD-10-CM

## 2017-04-11 DIAGNOSIS — Z3046 Encounter for surveillance of implantable subdermal contraceptive: Secondary | ICD-10-CM

## 2017-04-11 NOTE — Patient Instructions (Signed)
     IF you received an x-ray today, you will receive an invoice from [facility]. Please contact [facility] at [phone] with questions or concerns regarding your invoice.   IF you received labwork today, you will receive an invoice from [facility]. Please contact [facility] at [phone] with questions or concerns regarding your invoice.   Our billing staff will not be able to assist you with questions regarding bills from these companies.  You will be contacted with the lab results as soon as they are available. The fastest way to get your results is to activate your My Chart account. Instructions are located on the last page of this paperwork. If you have not heard from us regarding the results in 2 weeks, please contact this office.     

## 2017-04-11 NOTE — Progress Notes (Signed)
Subjective:    Patient ID: [name], female    DOB: 11/16/1985, 32 y.o.   MRN: [id number] Chief Complaint  Patient presents with  . Nexplanon removal    right arm    HPI [facility] - was told it was absolutely fine to keep for 4 yrs - would be just as effective, no condoms. Menses ~q8 wks.  she does NOT want it replaced at same visit - would like to wait sev wks between removal and reinsertion which is what she did prior.  She has been on her period for ~ 3 weeks straight now which is why she decided it was finally time to replace the Nexplanon.  Past Medical History:  Diagnosis Date  . Asthma   . Seasonal allergies    No past surgical history on file. Current Outpatient Medications on File Prior to Visit  Medication Sig Dispense Refill  . acetaminophen (TYLENOL) 325 MG tablet Take 650 mg by mouth every 6 (six) hours as needed for pain.    . fluticasone (FLONASE) 50 MCG/ACT nasal spray Place 2 sprays into both nostrils at bedtime. 16 g 2  . Phentermine-Topiramate (QSYMIA) 15-92 MG CP24 Take by mouth.    . Dulaglutide (TRULICITY) 1.5 MG/0.5ML SOPN Inject into the skin.    Marland Kitchen. etonogestrel (NEXPLANON) 68 MG IMPL implant 1 each by Subdermal route once.     No current facility-administered medications on file prior to visit.    No Known Allergies Family History  Problem Relation Age of Onset  . Cancer Father        prostate   . Liver disease Father   . Liver disease Maternal Grandfather    Social History   Socioeconomic History  . Marital status: Single    Spouse name: None  . Number of children: None  . Years of education: None  . Highest education level: None  Social Needs  . Financial resource strain: None  . Food insecurity - worry: None  . Food insecurity - inability: None  . Transportation needs - medical: None  . Transportation needs - non-medical: None  Occupational History  . None  Tobacco Use  .  Smoking status: Current Every Day Smoker    Packs/day: 0.50    Years: 4.00    Pack years: 2.00    Types: Cigarettes  . Smokeless tobacco: Never Used  Substance and Sexual Activity  . Alcohol use: Yes    Alcohol/week: 2.4 oz    Types: 2 Standard drinks or equivalent, 2 Glasses of wine per week  . Drug use: No  . Sexual activity: Yes    Birth control/protection: Implant  Other Topics Concern  . None  Social History Narrative  . None   Dep[facility] screen [facility] 2[facility] 01/11/2017 06/07/2016 12/30/2015 10/07/2015 05/17/2015  Decreased Interest 0 0 0 0 0  Down, Depressed, Hopeless 0 0 0 0 0  PHQ - 2 Score 0 0 0 0 0    Review of Systems  Constitutional: Negative for activity change.  Genitourinary: Positive for menstrual problem and vaginal bleeding.  Musculoskeletal: Negative for joint swelling and myalgias.       Objective:   Physical Exam  Constitutional: She is oriented to person, place, and time. She appears well-developed and well-nourished. No distress.  HENT:  Head: Normocephalic and atraumatic.  Right Ear: External ear normal.  Eyes: Conjunctivae are normal. No scleral icterus.  Pulmonary/Chest: Effort normal.  Neurological: She is alert  and oriented to person, place, and time.  Skin: Skin is warm and dry. She is not diaphoretic. No erythema.  Psychiatric: She has a normal mood and affect. Her behavior is normal.   Right arm positioned with shoulder abducted to 90 deg and externally rotated with elbow at >90 deg.  Nexplanon palpable along inner upper arm, with distal end several inches from medial epicondyle. Distal end marked and incision site immediately above and parallel to length of arm marked with sterile skin surgical marker. Area cleaned with betadine x 3.  Subcutaneous anesthesia with 2cc of 1% lidocaine with epi around and under distal end of Nexplanon with good result.  4mm incision made with 11 blade. Distal end of Nexplanon emerged through incision with manipulation of  proximal end of Nexplanon.  Grasped with hemostats and used 11 blade and traction to slowly remove the fibrous sheath that had grown around and was adhering device to soft tissue.  Nexplanon removed in tact with length 4.2 inches. Incision site gaping with subcutaneous fat emerging through incision so it was closed with Dermabond followed by a steri-strip.  Pressure bandage applied which pt will keep in place x 24 hrs to minimize hematomas and contusions. Pt tolerated procedure well. EBL <1cc. No complications.  Post-op wound care reviewed in detail and all questions answered.   BP 110/78   Pulse 82   Temp 97.7 F (36.5 C) (Oral)   Resp 16   Ht 5\' 3"  (1.6 m)   Wt 216 lb 9.6 oz (98.2 kg)   SpO2 99%   BMI 38.37 kg/m      Assessment & Plan:  get a PA for a new nexplanon to be placed in sev wks later per pt preference.  Advised pt to try to sched placement on day 1-5 of next menses though current DUB may make that difficult - luckily plenty of same day/next day appointments avail should help her schedule. Pt aware of potential for pregnancy currently as no contraception in system. Need to use condoms everytime.  1. Encounter for Nexplanon removal   2. Tobacco use disorder   3. Family planning       Norberto Sorenson, M.D.  Primary Care at [facility] [address] [place], [place] [address] [phone] phone [phone] fax  04/13/17 7:50 AM

## 2017-04-13 ENCOUNTER — Telehealth: Payer: Self-pay | Admitting: Family Medicine

## 2017-04-13 DIAGNOSIS — F17200 Nicotine dependence, unspecified, uncomplicated: Secondary | ICD-10-CM | POA: Insufficient documentation

## 2017-04-13 NOTE — Telephone Encounter (Signed)
Pt would like a Nexplanon placed in several weeks.  Does she need a PA? THanks!

## 2017-04-14 NOTE — Telephone Encounter (Signed)
Per [name]'s plan patient does not require a PA.  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  If she does owe anything the maximum would be a 20% co-pay.  I called patient and left this information and my direct number on her VM.

## 2017-04-18 NOTE — Telephone Encounter (Signed)
Chanda - Do we keep nexplanon on hand for whenever this pt decides to come in? Or do I need to put in an order?  Thanks

## 2017-04-30 NOTE — Telephone Encounter (Signed)
We do keep them in stock, primarily at [facility]. As the patient does not require prior auth, she would just need to be scheduled with a provider who can place it.

## 2017-07-16 ENCOUNTER — Other Ambulatory Visit: Payer: Self-pay

## 2017-07-16 ENCOUNTER — Encounter: Payer: Self-pay | Admitting: Emergency Medicine

## 2017-07-16 ENCOUNTER — Ambulatory Visit: Payer: BC Managed Care – PPO | Admitting: Emergency Medicine

## 2017-07-16 VITALS — BP 108/76 | HR 88 | Temp 98.5°F | Resp 16 | Ht 61.75 in | Wt 216.8 lb

## 2017-07-16 DIAGNOSIS — N760 Acute vaginitis: Secondary | ICD-10-CM

## 2017-07-16 LAB — POCT URINALYSIS DIP (MANUAL ENTRY)
BILIRUBIN UA: NEGATIVE
GLUCOSE UA: NEGATIVE mg/dL
Ketones, POC UA: NEGATIVE mg/dL
Leukocytes, UA: NEGATIVE
NITRITE UA: NEGATIVE
Protein Ur, POC: NEGATIVE mg/dL
RBC UA: NEGATIVE
SPEC GRAV UA: 1.025 (ref 1.010–1.025)
Urobilinogen, UA: 0.2 E.U./dL
pH, UA: 6 (ref 5.0–8.0)

## 2017-07-16 LAB — POCT WET + KOH PREP
TRICH BY WET PREP: ABSENT
YEAST BY KOH: ABSENT
Yeast by wet prep: ABSENT

## 2017-07-16 LAB — POCT URINE PREGNANCY: PREG TEST UR: NEGATIVE

## 2017-07-16 MED ORDER — FLUCONAZOLE 150 MG PO TABS: 150.0000 mg | ORAL_TABLET | Freq: Once | ORAL | 1 refills | Status: AC

## 2017-07-16 NOTE — Patient Instructions (Addendum)
   IF you received an x-ray today, you will receive an invoice from [facility]. Please contact [facility] at [phone] with questions or concerns regarding your invoice.   IF you received labwork today, you will receive an invoice from [facility]. Please contact [facility] at [phone] with questions or concerns regarding your invoice.   Our billing staff will not be able to assist you with questions regarding bills from these companies.  You will be contacted with the lab results as soon as they are available. The fastest way to get your results is to activate your My Chart account. Instructions are located on the last page of this paperwork. If you have not heard from us regarding the results in 2 weeks, please contact this office.      Vaginal Yeast infection, Adult Vaginal yeast infection is a condition that causes soreness, swelling, and redness (inflammation) of the vagina. It also causes vaginal discharge. This is a common condition. Some women get this infection frequently. What are the causes? This condition is caused by a change in the normal balance of the yeast (candida) and bacteria that live in the vagina. This change causes an overgrowth of yeast, which causes the inflammation. What increases the risk? This condition is more likely to develop in:  Women who take antibiotic medicines.  Women who have diabetes.  Women who take birth control pills.  Women who are pregnant.  Women who douche often.  Women who have a weak defense (immune) system.  Women who have been taking steroid medicines for a long time.  Women who frequently wear tight clothing.  What are the signs or symptoms? Symptoms of this condition include:  White, thick vaginal discharge.  Swelling, itching, redness, and irritation of the vagina. The lips of the vagina (vulva) may be affected as well.  Pain or a burning feeling while urinating.  Pain during sex.  How is  this diagnosed? This condition is diagnosed with a medical history and physical exam. This will include a pelvic exam. Your health care provider will examine a sample of your vaginal discharge under a microscope. Your health care provider may send this sample for testing to confirm the diagnosis. How is this treated? This condition is treated with medicine. Medicines may be over-the-counter or prescription. You may be told to use one or more of the following:  Medicine that is taken orally.  Medicine that is applied as a cream.  Medicine that is inserted directly into the vagina (suppository).  Follow these instructions at home:  Take or apply over-the-counter and prescription medicines only as told by your health care provider.  Do not have sex until your health care provider has approved. Tell your sex partner that you have a yeast infection. That person should go to his or her health care provider if he or she develops symptoms.  Do not wear tight clothes, such as pantyhose or tight pants.  Avoid using tampons until your health care provider approves.  Eat more yogurt. This may help to keep your yeast infection from returning.  Try taking a sitz bath to help with discomfort. This is a warm water bath that is taken while you are sitting down. The water should only come up to your hips and should cover your buttocks. Do this 3-4 times per day or as told by your health care provider.  Do not douche.  Wear breathable, cotton underwear.  If you have diabetes, keep your blood sugar levels under control.   Contact a health care provider if:  You have a fever.  Your symptoms go away and then return.  Your symptoms do not get better with treatment.  Your symptoms get worse.  You have new symptoms.  You develop blisters in or around your vagina.  You have blood coming from your vagina and it is not your menstrual period.  You develop pain in your abdomen. This information is not  intended to replace advice given to you by your health care provider. Make sure you discuss any questions you have with your health care provider. Document Released: 12/05/2004 Document Revised: 08/09/2015 Document Reviewed: 08/29/2014 Elsevier Interactive Patient Education  2018 Elsevier Inc.  

## 2017-07-16 NOTE — Progress Notes (Signed)
[name] 32 y.o.   Chief Complaint  Patient presents with  . Vaginitis    x 3-4 days  white mucus - no itching    HISTORY OF PRESENT ILLNESS: This is a 32 y.o. female complaining of vaginal itching with abnormal nonbloody discharge and some discomfort for the past 3 to 4 days.  Has had a similar episodes in the past diagnosed with bacterial vaginosis.  Denies pregnancy.  Denies abdominal pain, fever, chills, nausea or vomiting.  Has no urinary symptoms.  Has no chronic medical problems.  HPI   Prior to Admission medications   Medication Sig Start Date End Date Taking? Authorizing Provider  acetaminophen (TYLENOL) 325 MG tablet Take 650 mg by mouth every 6 (six) hours as needed for pain.   Yes [provider]  Dulaglutide (TRULICITY) 1.5 MG/0.5ML SOPN Inject into the skin.   Yes [provider]  fluticasone (FLONASE) 50 MCG/ACT nasal spray Place 2 sprays into both nostrils at bedtime. 12/30/15  Yes Sherren Mocha, MD  Phentermine-Topiramate (QSYMIA) 15-92 MG CP24 Take by mouth.   Yes [provider]  etonogestrel (NEXPLANON) 68 MG IMPL implant 1 each by Subdermal route once.    [provider]    No Known Allergies  Patient Active Problem List   Diagnosis Date Noted  . Tobacco use disorder 04/13/2017  . Obesity (BMI 30-39.9) 09/13/2014    Past Medical History:  Diagnosis Date  . Asthma   . Seasonal allergies     No past surgical history on file.  Social History   Socioeconomic History  . Marital status: Single    Spouse name: Not on file  . Number of children: Not on file  . Years of education: Not on file  . Highest education level: Not on file  Occupational History  . Not on file  Social Needs  . Financial resource strain: Not on file  . Food insecurity:    Worry: Not on file    Inability: Not on file  . Transportation needs:    Medical: Not on file    Non-medical: Not on file  Tobacco Use  . Smoking status: Current  Every Day Smoker    Packs/day: 0.50    Years: 4.00    Pack years: 2.00    Types: Cigarettes  . Smokeless tobacco: Never Used  Substance and Sexual Activity  . Alcohol use: Yes    Alcohol/week: 2.4 oz    Types: 2 Standard drinks or equivalent, 2 Glasses of wine per week  . Drug use: No  . Sexual activity: Yes    Birth control/protection: Implant  Lifestyle  . Physical activity:    Days per week: Not on file    Minutes per session: Not on file  . Stress: Not on file  Relationships  . Social connections:    Talks on phone: Not on file    Gets together: Not on file    Attends religious service: Not on file    Active member of club or organization: Not on file    Attends meetings of clubs or organizations: Not on file    Relationship status: Not on file  . Intimate partner violence:    Fear of current or ex partner: Not on file    Emotionally abused: Not on file    Physically abused: Not on file    Forced sexual activity: Not on file  Other Topics Concern  . Not on file  Social History Narrative  .  Not on file    Family History  Problem Relation Age of Onset  . Cancer Father        prostate   . Liver disease Father   . Liver disease Maternal Grandfather      Review of Systems  Constitutional: Negative.  Negative for chills and fever.  HENT: Negative.  Negative for sore throat.   Eyes: Negative.  Negative for discharge and redness.  Respiratory: Negative.  Negative for cough and shortness of breath.   Cardiovascular: Negative.  Negative for chest pain and palpitations.  Gastrointestinal: Negative.  Negative for abdominal pain, diarrhea, nausea and vomiting.  Genitourinary: Negative.  Negative for dysuria, frequency, hematuria and urgency.  Musculoskeletal: Negative.  Negative for back pain, myalgias and neck pain.  Neurological: Negative.  Negative for dizziness and headaches.  Endo/Heme/Allergies: Negative.    Vitals:   07/16/17 0829  BP: 108/76  Pulse: 88    Resp: 16  Temp: 98.5 F (36.9 C)  SpO2: 97%     Physical Exam  Constitutional: She is oriented to person, place, and time. She appears well-developed and well-nourished.  HENT:  Head: Normocephalic and atraumatic.  Eyes: Pupils are equal, round, and reactive to light. EOM are normal.  Neck: Normal range of motion. Neck supple.  Cardiovascular: Normal rate, regular rhythm and normal heart sounds.  Pulmonary/Chest: Effort normal and breath sounds normal.  Abdominal: Soft. Bowel sounds are normal. She exhibits no distension. There is no tenderness. Hernia confirmed negative in the right inguinal area and confirmed negative in the left inguinal area.  Genitourinary: Pelvic exam was performed with patient supine. There is no rash, tenderness or lesion on the right labia. There is no rash, tenderness or lesion on the left labia. Cervix exhibits no motion tenderness, no discharge and no friability. No erythema, tenderness or bleeding in the vagina. No foreign body in the vagina. No signs of injury around the vagina. Vaginal discharge found.  Musculoskeletal: Normal range of motion. She exhibits no edema.  Lymphadenopathy: No inguinal adenopathy noted on the left side.  Neurological: She is alert and oriented to person, place, and time. No sensory deficit. She exhibits normal muscle tone.  Skin: Skin is warm and dry. Capillary refill takes less than 2 seconds. No rash noted.  Psychiatric: She has a normal mood and affect. Her behavior is normal.  Vitals reviewed.  Results for orders placed or performed in visit on 07/16/17 (from the past 24 hour(s))  POCT Wet + KOH Prep     Status: Abnormal   Collection Time: 07/16/17  9:01 AM  Result Value Ref Range   Yeast by KOH Absent Absent   Yeast by wet prep Absent Absent   WBC by wet prep None (A) Few   Clue Cells Wet Prep HPF POC None None   Trich by wet prep Absent Absent   Bacteria Wet Prep HPF POC Few Few   Epithelial Cells By [employer] ([facility])  None None, Few, Too numerous to count   RBC,UR,HPF,POC None None RBC/hpf  POCT urine pregnancy     Status: None   Collection Time: 07/16/17  9:02 AM  Result Value Ref Range   Preg Test, Ur Negative Negative  POCT urinalysis dipstick     Status: None   Collection Time: 07/16/17  9:05 AM  Result Value Ref Range   Color, UA yellow yellow   Clarity, UA clear clear   Glucose, UA negative negative mg/dL   Bilirubin, UA negative negative  Ketones, POC UA negative negative mg/dL   Spec Grav, UA 0.981 1.914 - 1.025   Blood, UA negative negative   pH, UA 6.0 5.0 - 8.0   Protein Ur, POC negative negative mg/dL   Urobilinogen, UA 0.2 0.2 or 1.0 E.U./dL   Nitrite, UA Negative Negative   Leukocytes, UA Negative Negative    A total of 25 minutes was spent in the room with the patient, greater than 50% of which was in counseling/coordination of care regarding differential diagnosis, treatment, and need for GYN follow-up.  ASSESSMENT & PLAN: Marvette was seen today for vaginitis.  Diagnoses and all orders for this visit:  Acute vaginitis -     POCT urinalysis dipstick -     POCT urine pregnancy -     POCT Wet + KOH Prep -     GC/Chlamydia Probe Amp -     Ambulatory referral to Gynecology  Other orders -     fluconazole (DIFLUCAN) 150 MG tablet; Take 1 tablet (150 mg total) by mouth once for 1 dose.    Patient Instructions       IF you received an x-ray today, you will receive an invoice from [facility]. Please contact [facility] at [phone] with questions or concerns regarding your invoice.   IF you received labwork today, you will receive an invoice from [facility]. Please contact [facility] at [phone] with questions or concerns regarding your invoice.   Our billing staff will not be able to assist you with questions regarding bills from these companies.  You will be contacted with the lab results as soon as they are available. The fastest way to get your  results is to activate your My Chart account. Instructions are located on the last page of this paperwork. If you have not heard from [place] regarding the results in 2 weeks, please contact this office.     Vaginal Yeast infection, Adult Vaginal yeast infection is a condition that causes soreness, swelling, and redness (inflammation) of the vagina. It also causes vaginal discharge. This is a common condition. Some women get this infection frequently. What are the causes? This condition is caused by a change in the normal balance of the yeast (candida) and bacteria that live in the vagina. This change causes an overgrowth of yeast, which causes the inflammation. What increases the risk? This condition is more likely to develop in:  Women who take antibiotic medicines.  Women who have diabetes.  Women who take birth control pills.  Women who are pregnant.  Women who douche often.  Women who have a weak defense (immune) system.  Women who have been taking steroid medicines for a long time.  Women who frequently wear tight clothing.  What are the signs or symptoms? Symptoms of this condition include:  White, thick vaginal discharge.  Swelling, itching, redness, and irritation of the vagina. The lips of the vagina (vulva) may be affected as well.  Pain or a burning feeling while urinating.  Pain during sex.  How is this diagnosed? This condition is diagnosed with a medical history and physical exam. This will include a pelvic exam. Your health care provider will examine a sample of your vaginal discharge under a microscope. Your health care provider may send this sample for testing to confirm the diagnosis. How is this treated? This condition is treated with medicine. Medicines may be over-the-counter or prescription. You may be told to use one or more of the following:  Medicine that is taken  orally.  Medicine that is applied as a cream.  Medicine that is inserted directly into  the vagina (suppository).  Follow these instructions at home:  Take or apply over-the-counter and prescription medicines only as told by your health care provider.  Do not have sex until your health care provider has approved. Tell your sex partner that you have a yeast infection. That person should go to his or her health care provider if he or she develops symptoms.  Do not wear tight clothes, such as pantyhose or tight pants.  Avoid using tampons until your health care provider approves.  Eat more yogurt. This may help to keep your yeast infection from returning.  Try taking a sitz bath to help with discomfort. This is a warm water bath that is taken while you are sitting down. The water should only come up to your hips and should cover your buttocks. Do this 3-4 times per day or as told by your health care provider.  Do not douche.  Wear breathable, cotton underwear.  If you have diabetes, keep your blood sugar levels under control. Contact a health care provider if:  You have a fever.  Your symptoms go away and then return.  Your symptoms do not get better with treatment.  Your symptoms get worse.  You have new symptoms.  You develop blisters in or around your vagina.  You have blood coming from your vagina and it is not your menstrual period.  You develop pain in your abdomen. This information is not intended to replace advice given to you by your health care provider. Make sure you discuss any questions you have with your health care provider. Document Released: 12/05/2004 Document Revised: 08/09/2015 Document Reviewed: 08/29/2014 Elsevier Interactive Patient Education  2018 Elsevier Inc.      Edwina Barth, MD [facility]

## 2017-07-17 LAB — GC/CHLAMYDIA PROBE AMP
Chlamydia trachomatis, NAA: NEGATIVE
Neisseria gonorrhoeae by PCR: NEGATIVE

## 2017-07-21 ENCOUNTER — Encounter: Payer: Self-pay | Admitting: *Deleted

## 2017-08-18 ENCOUNTER — Ambulatory Visit: Payer: BC Managed Care – PPO | Admitting: Family Medicine

## 2017-08-18 ENCOUNTER — Observation Stay (HOSPITAL_COMMUNITY)
Admission: EM | Admit: 2017-08-18 | Discharge: 2017-08-21 | Disposition: A | Discharge status: Home / Self Care | Payer: BC Managed Care – PPO | Attending: Internal Medicine | Admitting: Internal Medicine

## 2017-08-18 ENCOUNTER — Encounter: Payer: Self-pay | Admitting: Family Medicine

## 2017-08-18 ENCOUNTER — Other Ambulatory Visit: Payer: Self-pay

## 2017-08-18 ENCOUNTER — Encounter (HOSPITAL_COMMUNITY): Payer: Self-pay

## 2017-08-18 ENCOUNTER — Ambulatory Visit (INDEPENDENT_AMBULATORY_CARE_PROVIDER_SITE_OTHER): Payer: BC Managed Care – PPO

## 2017-08-18 ENCOUNTER — Encounter: Payer: Self-pay | Admitting: Physician Assistant

## 2017-08-18 VITALS — BP 116/72 | HR 120 | Temp 98.7°F | Resp 18 | Ht 62.8 in | Wt 217.0 lb

## 2017-08-18 DIAGNOSIS — J9801 Acute bronchospasm: Secondary | ICD-10-CM

## 2017-08-18 DIAGNOSIS — J9621 Acute and chronic respiratory failure with hypoxia: Principal | ICD-10-CM | POA: Diagnosis present

## 2017-08-18 DIAGNOSIS — J029 Acute pharyngitis, unspecified: Secondary | ICD-10-CM

## 2017-08-18 DIAGNOSIS — N926 Irregular menstruation, unspecified: Secondary | ICD-10-CM

## 2017-08-18 DIAGNOSIS — Z23 Encounter for immunization: Secondary | ICD-10-CM | POA: Insufficient documentation

## 2017-08-18 DIAGNOSIS — Z79899 Other long term (current) drug therapy: Secondary | ICD-10-CM | POA: Insufficient documentation

## 2017-08-18 DIAGNOSIS — J189 Pneumonia, unspecified organism: Secondary | ICD-10-CM

## 2017-08-18 DIAGNOSIS — J181 Lobar pneumonia, unspecified organism: Secondary | ICD-10-CM | POA: Diagnosis present

## 2017-08-18 DIAGNOSIS — J4541 Moderate persistent asthma with (acute) exacerbation: Secondary | ICD-10-CM

## 2017-08-18 DIAGNOSIS — R0602 Shortness of breath: Secondary | ICD-10-CM

## 2017-08-18 DIAGNOSIS — E669 Obesity, unspecified: Secondary | ICD-10-CM | POA: Insufficient documentation

## 2017-08-18 DIAGNOSIS — J22 Unspecified acute lower respiratory infection: Secondary | ICD-10-CM

## 2017-08-18 DIAGNOSIS — Z6838 Body mass index (BMI) 38.0-38.9, adult: Secondary | ICD-10-CM | POA: Diagnosis not present

## 2017-08-18 DIAGNOSIS — J45901 Unspecified asthma with (acute) exacerbation: Secondary | ICD-10-CM | POA: Diagnosis present

## 2017-08-18 DIAGNOSIS — F17200 Nicotine dependence, unspecified, uncomplicated: Secondary | ICD-10-CM | POA: Diagnosis present

## 2017-08-18 DIAGNOSIS — F17210 Nicotine dependence, cigarettes, uncomplicated: Secondary | ICD-10-CM | POA: Insufficient documentation

## 2017-08-18 LAB — BASIC METABOLIC PANEL
ANION GAP: 10 (ref 5–15)
BUN: 5 mg/dL — ABNORMAL LOW (ref 6–20)
CO2: 23 mmol/L (ref 22–32)
Calcium: 9.3 mg/dL (ref 8.9–10.3)
Chloride: 106 mmol/L (ref 101–111)
Creatinine, Ser: 0.66 mg/dL (ref 0.44–1.00)
GFR calc Af Amer: >60 mL/min (ref >60)
GLUCOSE: 125 mg/dL — AB (ref 65–99)
POTASSIUM: 3.7 mmol/L (ref 3.5–5.1)
Sodium: 139 mmol/L (ref 135–145)

## 2017-08-18 LAB — POCT INFLUENZA A/B
Influenza A, POC: NEGATIVE
Influenza B, POC: NEGATIVE

## 2017-08-18 LAB — CBC
HEMATOCRIT: 40.7 % (ref 36.0–46.0)
HEMOGLOBIN: 13.5 g/dL (ref 12.0–15.0)
MCH: 28 pg (ref 26.0–34.0)
MCHC: 33.2 g/dL (ref 30.0–36.0)
MCV: 84.3 fL (ref 78.0–100.0)
Platelets: 272 10*3/uL (ref 150–400)
RBC: 4.83 MIL/uL (ref 3.87–5.11)
RDW: 14.2 % (ref 11.5–15.5)
WBC: 14.6 10*3/uL — ABNORMAL HIGH (ref 4.0–10.5)

## 2017-08-18 LAB — I-STAT TROPONIN, ED: Troponin i, poc: 0 ng/mL (ref 0.00–0.08)

## 2017-08-18 LAB — I-STAT BETA HCG BLOOD, ED (MC, WL, AP ONLY): I-stat hCG, quantitative: <5 m[IU]/mL (ref <5)

## 2017-08-18 LAB — POCT URINE PREGNANCY: PREG TEST UR: NEGATIVE

## 2017-08-18 LAB — POCT RAPID STREP A (OFFICE): Rapid Strep A Screen: NEGATIVE

## 2017-08-18 MED ORDER — IPRATROPIUM BROMIDE 0.02 % IN SOLN
0.5000 mg | Freq: Once | RESPIRATORY_TRACT | Status: AC
  Administered 2017-08-18: 0.5 mg via RESPIRATORY_TRACT

## 2017-08-18 MED ORDER — AZITHROMYCIN 250 MG PO TABS: ORAL_TABLET | ORAL | 0 refills | Status: DC

## 2017-08-18 MED ORDER — PREDNISONE 20 MG PO TABS: ORAL_TABLET | ORAL | 0 refills | Status: DC

## 2017-08-18 MED ORDER — ALBUTEROL SULFATE (2.5 MG/3ML) 0.083% IN NEBU
2.5000 mg | INHALATION_SOLUTION | Freq: Once | RESPIRATORY_TRACT | Status: AC
  Administered 2017-08-18: 2.5 mg via RESPIRATORY_TRACT

## 2017-08-18 MED ORDER — METHYLPREDNISOLONE SODIUM SUCC 125 MG IJ SOLR
125.0000 mg | Freq: Once | INTRAMUSCULAR | Status: AC
  Administered 2017-08-18: 125 mg via INTRAMUSCULAR

## 2017-08-18 MED ORDER — IPRATROPIUM-ALBUTEROL 0.5-2.5 (3) MG/3ML IN SOLN: 3.0000 mL | Freq: Once | RESPIRATORY_TRACT | Status: DC

## 2017-08-18 MED ORDER — CEFTRIAXONE SODIUM 1 G IJ SOLR
1.0000 g | Freq: Once | INTRAMUSCULAR | Status: AC
  Administered 2017-08-18: 1 g via INTRAMUSCULAR

## 2017-08-18 NOTE — ED Triage Notes (Signed)
Pt reports she has been having SOB since last night with cough. States that she saw her PCP today and was given antibiotics and breathing treatments today. Pt states she got 2 shots in her bottom and had 3 breathing treatments, since she was not getting better [facility] sent her here.

## 2017-08-18 NOTE — Patient Instructions (Addendum)
  PLEASE PRESENT IMMEDIATELY TO THE EMERGENCY DEPARTMENT FOR FURTHER TREATMENT OF YOUR ASTHMA EXACERBATION.    IF you received an x-ray today, you will receive an invoice from [facility]. Please contact [facility] at [phone] with questions or concerns regarding your invoice.   IF you received labwork today, you will receive an invoice from [facility]. Please contact [facility] at [phone] with questions or concerns regarding your invoice.   Our billing staff will not be able to assist you with questions regarding bills from these companies.  You will be contacted with the lab results as soon as they are available. The fastest way to get your results is to activate your My Chart account. Instructions are located on the last page of this paperwork. If you have not heard from us regarding the results in 2 weeks, please contact this office.

## 2017-08-18 NOTE — ED Provider Notes (Signed)
Patient placed in Quick Look pathway, seen and evaluated   Chief Complaint: shortness of breath  HPI:  [name] P Lehew is a 32 y.o. female with hx of asthma and pneumonia who present to the ED with c/o shortness of breath.   Pt reports she has been having SOB since last night with cough. States that she saw her PCP today and was given antibiotics and breathing treatments today. Pt states she got 2 shots in her bottom and had 3 breathing treatments, since she was not getting better [facility] sent her here.    ROS: Resp: shortness of breath  Physical Exam:  BP (!) 146/77 (BP Location: Right Arm)   Pulse (!) 115   Temp 98.9 F (37.2 C) (Oral)   Resp 19   Ht 5\' 3"  (1.6 m)   Wt 98.4 kg (217 lb)   LMP 07/18/2017   SpO2 94%   BMI 38.44 kg/m    Gen: No distress  Neuro: Awake and Alert  Skin: Warm and dry  Heart: tachycardic  Lungs: inspiratory and expiratory wheezing, decreased breath sounds.    Dg Chest 2 View  Result Date: 08/18/2017 CLINICAL DATA:  Cough and shortness of breath. EXAM: CHEST - 2 VIEW COMPARISON:  CT chest and single view of the chest 11/23/2015. FINDINGS: Lung volumes are low. There is streaky airspace disease in the lingula. The lungs are otherwise clear. Heart size is upper normal. No pneumothorax or pleural effusion. IMPRESSION: Streaky airspace disease in the lingula worrisome for pneumonia. Electronically Signed   By: Drusilla Kannerhomas  Dalessio M.D.   On: 08/18/2017 14:40      Initiation of care has begun. The patient has been counseled on the process, plan, and necessity for staying for the completion/evaluation, and the remainder of the medical screening examination    Janne Napoleoneese, Hope M, NP 08/18/17 1645    Derwood KaplanNanavati, Ankit, MD 08/21/17 1301

## 2017-08-18 NOTE — Progress Notes (Signed)
Subjective:    Patient ID: [name], female    DOB: 1985/07/04, 33 y.o.   MRN: [id number]  08/18/2017  Sore Throat (with some chills and mucus ) and Nasal Congestion (with some cheat pain and shob x 2 days )    HPI This 32 y.o. female presents for evaluation of sore throat and nasal congestion Onset last night; No fever; +chills/sweats. No headache.  No ear pain. Sore throat; middle; no pain with swallowing.  No pain with talking. No rhinorrhea; +nasal congestion. +SOB; +coughing; +mucous production; yellow green brown.   Wheezing some; asthma hx. Asthma admission with pneumonia in 2007.   ED visits rare yearly for asthma.   Has albuterol; never uses. +tobacco abuse; 1/2 ppd. No n/v/d.  Nephew has been sick.   Engineer, drilling.  Taking nothing; flonase. No albuterol use with acute illness. SOB kept up last night.    BP Readings from Last 3 Encounters:  08/18/17 116/72  07/16/17 108/76  04/11/17 110/78   Wt Readings from Last 3 Encounters:  08/18/17 217 lb (98.4 kg)  07/16/17 216 lb 12.8 oz (98.3 kg)  04/11/17 216 lb 9.6 oz (98.2 kg)   Immunization History  Administered Date(s) Administered  . Hepatitis B, adult 01/11/2017, 03/10/2017    Review of Systems  Constitutional: Positive for chills and diaphoresis. Negative for activity change, appetite change, fatigue, fever and unexpected weight change.  HENT: Positive for congestion and sore throat. Negative for dental problem, drooling, ear discharge, ear pain, facial swelling, hearing loss, mouth sores, nosebleeds, postnasal drip, rhinorrhea, sinus pressure, sneezing, tinnitus, trouble swallowing and voice change.   Eyes: Negative for photophobia, pain, discharge, redness, itching and visual disturbance.  Respiratory: Positive for cough and shortness of breath. Negative for apnea, choking, chest tightness, wheezing and stridor.   Cardiovascular: Negative for chest pain, palpitations and leg swelling.    Gastrointestinal: Negative for abdominal distention, abdominal pain, anal bleeding, blood in stool, constipation, diarrhea, nausea, rectal pain and vomiting.  Endocrine: Negative for cold intolerance, heat intolerance, polydipsia, polyphagia and polyuria.  Genitourinary: Negative for decreased urine volume, difficulty urinating, dyspareunia, dysuria, enuresis, flank pain, frequency, genital sores, hematuria, menstrual problem, pelvic pain, urgency, vaginal bleeding, vaginal discharge and vaginal pain.  Musculoskeletal: Negative for arthralgias, back pain, gait problem, joint swelling, myalgias, neck pain and neck stiffness.  Skin: Negative for color change, pallor, rash and wound.  Allergic/Immunologic: Negative for environmental allergies, food allergies and immunocompromised state.  Neurological: Negative for dizziness, tremors, seizures, syncope, facial asymmetry, speech difficulty, weakness, light-headedness, numbness and headaches.  Hematological: Negative for adenopathy. Does not bruise/bleed easily.  Psychiatric/Behavioral: Negative for agitation, behavioral problems, confusion, decreased concentration, dysphoric mood, hallucinations, self-injury, sleep disturbance and suicidal ideas. The patient is not nervous/anxious and is not hyperactive.     Past Medical History:  Diagnosis Date  . Allergy   . Asthma   . Seasonal allergies    History reviewed. No pertinent surgical history. No Known Allergies Current Outpatient Medications on File Prior to Visit  Medication Sig Dispense Refill  . acetaminophen (TYLENOL) 325 MG tablet Take 650 mg by mouth every 6 (six) hours as needed for pain.    . Dulaglutide (TRULICITY) 1.5 MG/0.5ML SOPN Inject into the skin.    . fluticasone (FLONASE) 50 MCG/ACT nasal spray Place 2 sprays into both nostrils at bedtime. 16 g 2  . Phentermine-Topiramate (QSYMIA) 15-92 MG CP24 Take by mouth.     No current facility-administered medications on file prior to  visit.    Social History   Socioeconomic History  . Marital status: Single    Spouse name: Not on file  . Number of children: Not on file  . Years of education: Not on file  . Highest education level: Not on file  Occupational History  . Not on file  Social Needs  . Financial resource strain: Not on file  . Food insecurity:    Worry: Not on file    Inability: Not on file  . Transportation needs:    Medical: Not on file    Non-medical: Not on file  Tobacco Use  . Smoking status: Current Every Day Smoker    Packs/day: 0.50    Years: 4.00    Pack years: 2.00    Types: Cigarettes  . Smokeless tobacco: Never Used  Substance and Sexual Activity  . Alcohol use: Yes    Alcohol/week: 2.4 oz    Types: 2 Glasses of wine, 2 Standard drinks or equivalent per week  . Drug use: No  . Sexual activity: Yes    Birth control/protection: Implant  Lifestyle  . Physical activity:    Days per week: Not on file    Minutes per session: Not on file  . Stress: Not on file  Relationships  . Social connections:    Talks on phone: Not on file    Gets together: Not on file    Attends religious service: Not on file    Active member of club or organization: Not on file    Attends meetings of clubs or organizations: Not on file    Relationship status: Not on file  . Intimate partner violence:    Fear of current or ex partner: Not on file    Emotionally abused: Not on file    Physically abused: Not on file    Forced sexual activity: Not on file  Other Topics Concern  . Not on file  Social History Narrative  . Not on file   Family History  Problem Relation Age of Onset  . Cancer Father        prostate   . Liver disease Father   . Liver disease Maternal Grandfather        Objective:    BP 116/72   Pulse (!) 120   Temp 98.7 F (37.1 C) (Oral)   Resp 18   Ht 5' 2.8" (1.595 m)   Wt 217 lb (98.4 kg)   SpO2 94%   PF 250 L/min   BMI 38.69 kg/m  Physical Exam  Constitutional: She  is oriented to person, place, and time. She appears well-developed and well-nourished.  Non-toxic appearance. She does not appear ill. No distress.  HENT:  Head: Normocephalic and atraumatic.  Right Ear: Tympanic membrane and ear canal normal.  Left Ear: Tympanic membrane and ear canal normal.  Mouth/Throat: No oral lesions. No uvula swelling. Posterior oropharyngeal erythema present. No oropharyngeal exudate, posterior oropharyngeal edema or tonsillar abscesses.  Eyes: Pupils are equal, round, and reactive to light. Conjunctivae are normal.  Neck: Normal range of motion. Neck supple. No thyromegaly present.  Cardiovascular: Normal rate, regular rhythm and normal heart sounds. Exam reveals no gallop and no friction rub.  No murmur heard. Pulmonary/Chest: Accessory muscle usage present. Tachypnea noted. She is in respiratory distress. She has decreased breath sounds. She has wheezes in the right middle field and the left middle field. She has no rhonchi. She has no rales.  RR 30.  Lymphadenopathy:  She has no cervical adenopathy.  Neurological: She is alert and oriented to person, place, and time.  Skin: She is not diaphoretic.  Psychiatric: She has a normal mood and affect. Her behavior is normal.  Nursing note and vitals reviewed.  Dg Chest 2 View  Result Date: 08/18/2017 CLINICAL DATA:  Cough and shortness of breath. EXAM: CHEST - 2 VIEW COMPARISON:  CT chest and single view of the chest 11/23/2015. FINDINGS: Lung volumes are low. There is streaky airspace disease in the lingula. The lungs are otherwise clear. Heart size is upper normal. No pneumothorax or pleural effusion. IMPRESSION: Streaky airspace disease in the lingula worrisome for pneumonia. Electronically Signed   By: Drusilla Kanner M.D.   On: 08/18/2017 14:40   Depression screen [facility] 2/9 07/16/2017 01/11/2017 06/07/2016 12/30/2015 10/07/2015  Decreased Interest 0 0 0 0 0  Down, Depressed, Hopeless 0 0 0 0 0  PHQ - 2 Score 0 0 0 0  0   Fall Risk  07/16/2017 01/11/2017 06/07/2016 12/30/2015 10/07/2015  Falls in the past year? No No No No No   PEAK FLOW AT 12:30PM:  150, 150, 150 ALBUTEROL/ATROVENT NEBULIZER INITIATED AT 12:37 BY ELANA, CMA. SOLUMEDROL 125MG  IM AND CEFTRIAXONE 1000MG  IM ADMINISTERED. PEAK FLOW AT 13:15:  200, 200, 200.  PULSE OXIMETRY ROOM AIR 93%, PULSE 100. ALBUTEROL/ATROVENT NEBULIZER #2 INITIATED AT 13:25. PEAK FLOW AT 13:50: 250, 250, 250. PULSE OXIMETRY 93%, PULSE 116. ALBUTEROL/ATROVENT NEBULIZER #3 INITIATED 14:00. PEAK FLOW 1430: 250, 250, 250; PULSE OXIMETRY 94%    Results for orders placed or performed in visit on 08/18/17  POCT rapid strep A  Result Value Ref Range   Rapid Strep A Screen Negative Negative  POCT Influenza A/B  Result Value Ref Range   Influenza A, POC Negative Negative   Influenza B, POC Negative Negative  POCT urine pregnancy  Result Value Ref Range   Preg Test, Ur Negative Negative   Dg Chest 2 View  Result Date: 08/18/2017 CLINICAL DATA:  Cough and shortness of breath. EXAM: CHEST - 2 VIEW COMPARISON:  CT chest and single view of the chest 11/23/2015. FINDINGS: Lung volumes are low. There is streaky airspace disease in the lingula. The lungs are otherwise clear. Heart size is upper normal. No pneumothorax or pleural effusion. IMPRESSION: Streaky airspace disease in the lingula worrisome for pneumonia. Electronically Signed   By: Drusilla Kanner M.D.   On: 08/18/2017 14:40       Assessment & Plan:   1. Moderate persistent asthma with acute exacerbation   2. Sore throat   3. Shortness of breath   4. Bronchospasm   5. Lower respiratory infection   6. Irregular menses   7. Community acquired pneumonia of left lower lobe of lung (HCC)     To ED for further evaluation; minimal to no improvement with duoneb x 3, solumedrol 125mg  IM.  S/p Rocephin.    Orders Placed This Encounter  Procedures  . DG Chest 2 View    Standing Status:   Future    Number of  Occurrences:   1    Standing Expiration Date:   08/18/2018    Order Specific Question:   Reason for Exam (SYMPTOM  OR DIAGNOSIS REQUIRED)    Answer:   cough, sob    Order Specific Question:   Is the patient pregnant?    Answer:   No    Order Specific Question:   Preferred imaging location?    Answer:   External  .  CBC with Differential/Platelet  . Comprehensive metabolic panel  . POCT rapid strep A  . POCT Influenza A/B  . POCT urine pregnancy   Meds ordered this encounter  Medications  . albuterol (PROVENTIL) (2.5 MG/3ML) 0.083% nebulizer solution 2.5 mg  . ipratropium (ATROVENT) nebulizer solution 0.5 mg  . cefTRIAXone (ROCEPHIN) injection 1 g  . methylPREDNISolone sodium succinate (SOLU-MEDROL) 125 mg/2 mL injection 125 mg  . albuterol (PROVENTIL) (2.5 MG/3ML) 0.083% nebulizer solution 2.5 mg  . ipratropium (ATROVENT) nebulizer solution 0.5 mg  . azithromycin (ZITHROMAX) 250 MG tablet    Sig: Two tablets daily x 5 days    Dispense:  10 tablet    Refill:  0  . predniSONE (DELTASONE) 20 MG tablet    Sig: Take 3 PO QAM x 3 day, 2 PO QAM x 3 days, 1 PO QAM x 3 days    Dispense:  18 tablet    Refill:  0    No follow-ups on file.   Marvis Saefong Paulita FujitaMartin Kinshasa Throckmorton, M.D. [facility]  [place] [facility] [address] [place], [place]  [id number] [phone] phone [phone] fax

## 2017-08-19 ENCOUNTER — Encounter (HOSPITAL_COMMUNITY): Payer: Self-pay | Admitting: General Practice

## 2017-08-19 DIAGNOSIS — F17200 Nicotine dependence, unspecified, uncomplicated: Secondary | ICD-10-CM | POA: Diagnosis not present

## 2017-08-19 DIAGNOSIS — J9621 Acute and chronic respiratory failure with hypoxia: Secondary | ICD-10-CM | POA: Diagnosis not present

## 2017-08-19 DIAGNOSIS — E669 Obesity, unspecified: Secondary | ICD-10-CM | POA: Diagnosis not present

## 2017-08-19 DIAGNOSIS — J45901 Unspecified asthma with (acute) exacerbation: Secondary | ICD-10-CM

## 2017-08-19 DIAGNOSIS — J181 Lobar pneumonia, unspecified organism: Secondary | ICD-10-CM | POA: Diagnosis present

## 2017-08-19 DIAGNOSIS — A419 Sepsis, unspecified organism: Secondary | ICD-10-CM | POA: Insufficient documentation

## 2017-08-19 LAB — COMPREHENSIVE METABOLIC PANEL
ALBUMIN: 4.9 g/dL (ref 3.5–5.5)
ALK PHOS: 76 IU/L (ref 39–117)
ALT: 15 IU/L (ref 0–32)
AST: 13 IU/L (ref 0–40)
Albumin/Globulin Ratio: 1.8 (ref 1.2–2.2)
BUN / CREAT RATIO: 10 (ref 9–23)
BUN: 6 mg/dL (ref 6–20)
Bilirubin Total: 0.4 mg/dL (ref 0.0–1.2)
CO2: 20 mmol/L (ref 20–29)
CREATININE: 0.62 mg/dL (ref 0.57–1.00)
Calcium: 9.5 mg/dL (ref 8.7–10.2)
Chloride: 101 mmol/L (ref 96–106)
GFR calc Af Amer: 141 mL/min/{1.73_m2} (ref >59)
GFR calc non Af Amer: 122 mL/min/{1.73_m2} (ref >59)
GLUCOSE: 93 mg/dL (ref 65–99)
Globulin, Total: 2.8 g/dL (ref 1.5–4.5)
Potassium: 4.1 mmol/L (ref 3.5–5.2)
Sodium: 139 mmol/L (ref 134–144)
Total Protein: 7.7 g/dL (ref 6.0–8.5)

## 2017-08-19 LAB — CBC WITH DIFFERENTIAL/PLATELET
BASOS ABS: 0 10*3/uL (ref 0.0–0.2)
BASOS: 0 %
EOS (ABSOLUTE): 0.2 10*3/uL (ref 0.0–0.4)
Eos: 1 %
Hematocrit: 41 % (ref 34.0–46.6)
Hemoglobin: 13.8 g/dL (ref 11.1–15.9)
Immature Grans (Abs): 0 10*3/uL (ref 0.0–0.1)
Immature Granulocytes: 0 %
Lymphocytes Absolute: 1.5 10*3/uL (ref 0.7–3.1)
Lymphs: 12 %
MCH: 28.3 pg (ref 26.6–33.0)
MCHC: 33.7 g/dL (ref 31.5–35.7)
MCV: 84 fL (ref 79–97)
Monocytes Absolute: 0.9 10*3/uL (ref 0.1–0.9)
Monocytes: 7 %
NEUTROS ABS: 10.3 10*3/uL — AB (ref 1.4–7.0)
NEUTROS PCT: 80 %
PLATELETS: 280 10*3/uL (ref 150–450)
RBC: 4.87 x10E6/uL (ref 3.77–5.28)
RDW: 14.6 % (ref 12.3–15.4)
WBC: 12.9 10*3/uL — ABNORMAL HIGH (ref 3.4–10.8)

## 2017-08-19 LAB — RESPIRATORY PANEL BY PCR
ADENOVIRUS-RVPPCR: NOT DETECTED
Bordetella pertussis: NOT DETECTED
CORONAVIRUS NL63-RVPPCR: NOT DETECTED
CORONAVIRUS OC43-RVPPCR: NOT DETECTED
Chlamydophila pneumoniae: NOT DETECTED
Coronavirus 229E: NOT DETECTED
Coronavirus HKU1: NOT DETECTED
INFLUENZA A-RVPPCR: NOT DETECTED
Influenza B: NOT DETECTED
Metapneumovirus: NOT DETECTED
Mycoplasma pneumoniae: NOT DETECTED
PARAINFLUENZA VIRUS 2-RVPPCR: NOT DETECTED
PARAINFLUENZA VIRUS 4-RVPPCR: NOT DETECTED
Parainfluenza Virus 1: NOT DETECTED
Parainfluenza Virus 3: NOT DETECTED
Respiratory Syncytial Virus: NOT DETECTED
Rhinovirus / Enterovirus: DETECTED — AB

## 2017-08-19 LAB — HIV ANTIBODY (ROUTINE TESTING W REFLEX): HIV Screen 4th Generation wRfx: NONREACTIVE

## 2017-08-19 LAB — STREP PNEUMONIAE URINARY ANTIGEN: Strep Pneumo Urinary Antigen: NEGATIVE

## 2017-08-19 LAB — LACTIC ACID, PLASMA
Lactic Acid, Venous: 2.9 mmol/L (ref 0.5–1.9)
Lactic Acid, Venous: 2.5 mmol/L (ref 0.5–1.9)

## 2017-08-19 LAB — PROCALCITONIN: Procalcitonin: <0.1 ng/mL

## 2017-08-19 MED ORDER — SODIUM CHLORIDE 0.9 % IV SOLN
500.0000 mg | INTRAVENOUS | Status: DC
  Administered 2017-08-19 – 2017-08-21 (×3): 500 mg via INTRAVENOUS
  Filled 2017-08-19 (×3): qty 500

## 2017-08-19 MED ORDER — IPRATROPIUM BROMIDE 0.02 % IN SOLN
0.5000 mg | Freq: Once | RESPIRATORY_TRACT | Status: AC
  Administered 2017-08-19: 0.5 mg via RESPIRATORY_TRACT
  Filled 2017-08-19: qty 2.5

## 2017-08-19 MED ORDER — DM-GUAIFENESIN ER 30-600 MG PO TB12
1.0000 | ORAL_TABLET | Freq: Two times a day (BID) | ORAL | Status: DC | PRN
  Administered 2017-08-19: 1 via ORAL
  Filled 2017-08-19 (×2): qty 1

## 2017-08-19 MED ORDER — METHYLPREDNISOLONE SODIUM SUCC 125 MG IJ SOLR
60.0000 mg | Freq: Two times a day (BID) | INTRAMUSCULAR | Status: DC
  Administered 2017-08-19 – 2017-08-20 (×3): 60 mg via INTRAVENOUS
  Filled 2017-08-19 (×3): qty 2

## 2017-08-19 MED ORDER — OXYCODONE-ACETAMINOPHEN 5-325 MG PO TABS
2.0000 | ORAL_TABLET | Freq: Once | ORAL | Status: AC
  Administered 2017-08-19: 2 via ORAL
  Filled 2017-08-19: qty 2

## 2017-08-19 MED ORDER — ACETAMINOPHEN 325 MG PO TABS
650.0000 mg | ORAL_TABLET | Freq: Four times a day (QID) | ORAL | Status: DC | PRN
  Administered 2017-08-19 – 2017-08-20 (×2): 650 mg via ORAL
  Filled 2017-08-19 (×2): qty 2

## 2017-08-19 MED ORDER — SODIUM CHLORIDE 0.9 % IV BOLUS
1000.0000 mL | Freq: Once | INTRAVENOUS | Status: AC
  Administered 2017-08-19: 1000 mL via INTRAVENOUS

## 2017-08-19 MED ORDER — ALBUTEROL SULFATE (2.5 MG/3ML) 0.083% IN NEBU
5.0000 mg | INHALATION_SOLUTION | Freq: Once | RESPIRATORY_TRACT | Status: AC
Start: 2017-08-19 — End: 2017-08-19
  Administered 2017-08-19: 5 mg via RESPIRATORY_TRACT
  Filled 2017-08-19: qty 6

## 2017-08-19 MED ORDER — LEVALBUTEROL HCL 1.25 MG/0.5ML IN NEBU
1.2500 mg | INHALATION_SOLUTION | Freq: Four times a day (QID) | RESPIRATORY_TRACT | Status: DC | PRN
  Administered 2017-08-19 – 2017-08-21 (×3): 1.25 mg via RESPIRATORY_TRACT
  Filled 2017-08-19 (×3): qty 0.5

## 2017-08-19 MED ORDER — LORATADINE 10 MG PO TABS
10.0000 mg | ORAL_TABLET | Freq: Once | ORAL | Status: AC
  Administered 2017-08-19: 10 mg via ORAL
  Filled 2017-08-19: qty 1

## 2017-08-19 MED ORDER — ENOXAPARIN SODIUM 40 MG/0.4ML ~~LOC~~ SOLN
40.0000 mg | Freq: Every day | SUBCUTANEOUS | Status: DC
  Administered 2017-08-20: 40 mg via SUBCUTANEOUS
  Filled 2017-08-19 (×3): qty 0.4

## 2017-08-19 MED ORDER — IPRATROPIUM BROMIDE 0.02 % IN SOLN: 0.5000 mg | RESPIRATORY_TRACT | Status: DC

## 2017-08-19 MED ORDER — MAGNESIUM SULFATE 2 GM/50ML IV SOLN
2.0000 g | INTRAVENOUS | Status: AC
  Administered 2017-08-19: 2 g via INTRAVENOUS
  Filled 2017-08-19: qty 50

## 2017-08-19 MED ORDER — IPRATROPIUM BROMIDE 0.02 % IN SOLN
0.5000 mg | Freq: Four times a day (QID) | RESPIRATORY_TRACT | Status: DC
  Administered 2017-08-19: 0.5 mg via RESPIRATORY_TRACT
  Filled 2017-08-19: qty 2.5

## 2017-08-19 MED ORDER — LEVALBUTEROL HCL 1.25 MG/0.5ML IN NEBU
1.2500 mg | INHALATION_SOLUTION | Freq: Four times a day (QID) | RESPIRATORY_TRACT | Status: DC
  Administered 2017-08-19: 1.25 mg via RESPIRATORY_TRACT
  Filled 2017-08-19: qty 0.5

## 2017-08-19 MED ORDER — CEFTRIAXONE SODIUM 1 G IJ SOLR
1.0000 g | INTRAMUSCULAR | Status: DC
  Administered 2017-08-19 – 2017-08-21 (×3): 1 g via INTRAVENOUS
  Filled 2017-08-19 (×3): qty 10

## 2017-08-19 MED ORDER — LEVALBUTEROL HCL 1.25 MG/0.5ML IN NEBU
1.2500 mg | INHALATION_SOLUTION | Freq: Four times a day (QID) | RESPIRATORY_TRACT | Status: DC
  Filled 2017-08-19: qty 0.5

## 2017-08-19 MED ORDER — FLUTICASONE PROPIONATE 50 MCG/ACT NA SUSP
2.0000 | Freq: Every day | NASAL | Status: DC
  Administered 2017-08-19 – 2017-08-20 (×2): 2 via NASAL
  Filled 2017-08-19: qty 16

## 2017-08-19 MED ORDER — SODIUM CHLORIDE 0.9 % IV SOLN
INTRAVENOUS | Status: DC
  Administered 2017-08-19 – 2017-08-20 (×2): via INTRAVENOUS

## 2017-08-19 MED ORDER — LORATADINE 10 MG PO TABS
10.0000 mg | ORAL_TABLET | Freq: Every day | ORAL | Status: DC
  Administered 2017-08-19 – 2017-08-21 (×3): 10 mg via ORAL
  Filled 2017-08-19 (×3): qty 1

## 2017-08-19 MED ORDER — NICOTINE 21 MG/24HR TD PT24
21.0000 mg | MEDICATED_PATCH | Freq: Every day | TRANSDERMAL | Status: DC
  Filled 2017-08-19 (×2): qty 1

## 2017-08-19 MED ORDER — PNEUMOCOCCAL VAC POLYVALENT 25 MCG/0.5ML IJ INJ
0.5000 mL | INJECTION | INTRAMUSCULAR | Status: AC
  Administered 2017-08-20: 0.5 mL via INTRAMUSCULAR
  Filled 2017-08-19: qty 0.5

## 2017-08-19 MED ORDER — SODIUM CHLORIDE 0.9 % IV BOLUS
2000.0000 mL | Freq: Once | INTRAVENOUS | Status: AC
  Administered 2017-08-19: 2000 mL via INTRAVENOUS

## 2017-08-19 NOTE — ED Notes (Signed)
Pt O2 level in high 80% while laying in bed. Pt placed on 2L O2 [place].

## 2017-08-19 NOTE — ED Notes (Signed)
ED Provider at bedside. 

## 2017-08-19 NOTE — Progress Notes (Signed)
[name] [id number] Admission Data: 08/19/2017 12:32 PM Attending Provider: Joseph ArtVann, Jessica U, DO  WUJ:WJXBPCP:Shaw, [name], MD Consults/ Treatment Team:   Taylor CrutchEbony P Weeks is a 32 y.o. female patient admitted from ED awake, alert  & orientated  X 3,  Full Code, VSS - Blood pressure 139/80, pulse (!) 103, temperature 97.7 F (36.5 C), temperature source Oral, resp. rate 19, height 5\' 3"  (1.6 m), weight 98.4 kg (217 lb), last menstrual period 07/18/2017, SpO2 94 %. on Room air, no c/o shortness of breath, no c/o chest pain, no distress noted. Tele # [id number] placed and pt is currently running:sinus tachycardia.   IV site WDL:  antecubital right, condition patent and no redness with a transparent dsg that's clean dry and intact.  Allergies:  No Known Allergies   Past Medical History:  Diagnosis Date  . Allergy   . Asthma   . Seasonal allergies     History:  obtained from the patient.Pt orientation to unit, room and routine. Information packet given to patient/family and safety video watched.  Admission INP armband ID verified with patient/family, and in place. SR up x 2, fall risk assessment complete with Patient and family verbalizing understanding of risks associated with falls. Pt verbalizes an understanding of how to use the call bell and to call for help before getting out of bed.  Skin, clean-dry- intact without evidence of bruising, or skin tears.   No evidence of skin break down noted on exam.   Will cont to monitor and assist as needed.  [name] [name], RN 08/19/2017 12:32 PM

## 2017-08-19 NOTE — Progress Notes (Signed)
CRITICAL VALUE ALERT  Critical Value: Lactic Acid 2.5  Date & Time Notied:  08/19/2017 1000  Provider Notified: Dr. Benjamine MolaVann Orders Received/Actions taken: none

## 2017-08-19 NOTE — ED Notes (Signed)
Date and time results received: 08/19/17 0538 (use smartphrase ".now" to insert current time)  Test: Lactic Critical Value:2.9  Name of Provider Notified: Clyde LundborgNIU, MD

## 2017-08-19 NOTE — ED Provider Notes (Signed)
[facility] EMERGENCY DEPARTMENT Provider Note   CSN: [id number] Arrival date & time: 08/18/17  1551     History   Chief Complaint Chief Complaint  Patient presents with  . Shortness of Breath    HPI [name] is a 32 y.o. female.   32 year old female with a history of asthma and seasonal allergies presents to the emergency department for worsening chest tightness.  Symptoms have been worsening over the past 2 days.  She was sent into the emergency department after evaluation at urgent care.  Symptoms associated with nasal congestion as well as cough productive of green phlegm.  She also reports chills.  No documented fever.  Patient states the symptoms feel similar to when she had pneumonia.  She was given a shot of Rocephin as well as 125 mg Solu-Medrol at [facility] urgent care prior to transfer; this was after receiving 3 DuoNeb treatments with minimal relief.  Last hospitalization for respiratory exacerbation was 2/2 PNA in ~2007.  The history is provided by the patient. No language interpreter was used.  Shortness of Breath     Past Medical History:  Diagnosis Date  . Allergy   . Asthma   . Seasonal allergies     Patient Active Problem List   Diagnosis Date Noted  . Acute on chronic respiratory failure with hypoxia (HCC) 08/19/2017  . Asthma exacerbation 08/19/2017  . Lobar pneumonia (HCC) 08/19/2017  . Tobacco use disorder 04/13/2017  . Obesity (BMI 30-39.9) 09/13/2014    History reviewed. No pertinent surgical history.   OB History   None      Home Medications    Prior to Admission medications   Medication Sig Start Date End Date Taking? Authorizing Provider  acetaminophen (TYLENOL) 325 MG tablet Take 650 mg by mouth every 6 (six) hours as needed for pain.    [provider]  azithromycin (ZITHROMAX) 250 MG tablet Two tablets daily x 5 days 08/18/17   Ethelda Chick, MD  Dulaglutide (TRULICITY) 1.5 MG/0.5ML SOPN Inject into  the skin.    [provider]  fluticasone (FLONASE) 50 MCG/ACT nasal spray Place 2 sprays into both nostrils at bedtime. 12/30/15   Sherren Mocha, MD  Phentermine-Topiramate (QSYMIA) 15-92 MG CP24 Take by mouth.    [provider]  predniSONE (DELTASONE) 20 MG tablet Take 3 PO QAM x 3 day, 2 PO QAM x 3 days, 1 PO QAM x 3 days 08/18/17   Ethelda Chick, MD    Family History Family History  Problem Relation Age of Onset  . Cancer Father        prostate   . Liver disease Father   . Liver disease Maternal Grandfather     Social History Social History   Tobacco Use  . Smoking status: Current Every Day Smoker    Packs/day: 0.50    Years: 4.00    Pack years: 2.00    Types: Cigarettes  . Smokeless tobacco: Never Used  Substance Use Topics  . Alcohol use: Yes    Alcohol/week: 2.4 oz    Types: 2 Glasses of wine, 2 Standard drinks or equivalent per week  . Drug use: No     Allergies   Patient has no known allergies.   Review of Systems Review of Systems  Respiratory: Positive for shortness of breath.   Ten systems reviewed and are negative for acute change, except as noted in the HPI.    Physical Exam Updated Vital  Signs BP 128/62   Pulse (!) 115   Temp 98 F (36.7 C) (Oral)   Resp 18   Ht 5\' 3"  (1.6 m)   Wt 98.4 kg (217 lb)   LMP 07/18/2017   SpO2 90%   BMI 38.44 kg/m   Physical Exam  Constitutional: She is oriented to person, place, and time. She appears well-developed and well-nourished. No distress.  Nontoxic appearing and in no acute distress.  HENT:  Head: Normocephalic and atraumatic.  Nasal congestion with mucosal edema.  There is irritation around the nares.  Eyes: Conjunctivae and EOM are normal. No scleral icterus.  Neck: Normal range of motion.  Cardiovascular: Regular rhythm and intact distal pulses.  Intermittent tachycardia  Pulmonary/Chest: Effort normal. No respiratory distress.  Decreased breath sounds in all lung fields.  No  accessory muscle use or tachypnea.  No wheezing or rales appreciated.  Musculoskeletal: Normal range of motion.  No lower extremity swelling  Neurological: She is alert and oriented to person, place, and time. She exhibits normal muscle tone. Coordination normal.  Skin: Skin is warm and dry. No rash noted. She is not diaphoretic. No erythema. No pallor.  Psychiatric: She has a normal mood and affect. Her behavior is normal.  Nursing note and vitals reviewed.    ED Treatments / Results  Labs (all labs ordered are listed, but only abnormal results are displayed) Labs Reviewed  BASIC METABOLIC PANEL - Abnormal; Notable for the following components:      Result Value   Glucose, Bld 125 (*)    BUN 5 (*)    All other components within normal limits  CBC - Abnormal; Notable for the following components:   WBC 14.6 (*)    All other components within normal limits  I-STAT TROPONIN, ED  I-STAT BETA HCG BLOOD, ED ([facility], [facility], [facility] ONLY)    EKG EKG Interpretation  Date/Time:  Monday August 18 2017 16:18:50 EDT Ventricular Rate:  115 PR Interval:  156 QRS Duration: 80 QT Interval:  322 QTC Calculation: 445 R Axis:   31 Text Interpretation:  Sinus tachycardia Otherwise normal ECG Confirmed by Zadie Rhine ([id number]) on 08/19/2017 1:02:48 AM   Radiology Dg Chest 2 View  Result Date: 08/18/2017 CLINICAL DATA:  Cough and shortness of breath. EXAM: CHEST - 2 VIEW COMPARISON:  CT chest and single view of the chest 11/23/2015. FINDINGS: Lung volumes are low. There is streaky airspace disease in the lingula. The lungs are otherwise clear. Heart size is upper normal. No pneumothorax or pleural effusion. IMPRESSION: Streaky airspace disease in the lingula worrisome for pneumonia. Electronically Signed   By: Drusilla Kanner M.D.   On: 08/18/2017 14:40    Procedures Procedures (including critical care time)  Medications Ordered in ED Medications  azithromycin (ZITHROMAX) 500 mg in sodium chloride  0.9 % 250 mL IVPB (has no administration in time range)  magnesium sulfate IVPB 2 g 50 mL (has no administration in time range)  albuterol (PROVENTIL) (2.5 MG/3ML) 0.083% nebulizer solution 5 mg (5 mg Nebulization Given 08/19/17 0145)  ipratropium (ATROVENT) nebulizer solution 0.5 mg (0.5 mg Nebulization Given 08/19/17 0145)  oxyCODONE-acetaminophen (PERCOCET/ROXICET) 5-325 MG per tablet 2 tablet (2 tablets Oral Given 08/19/17 0143)  loratadine (CLARITIN) tablet 10 mg (10 mg Oral Given 08/19/17 0144)     Initial Impression / Assessment and Plan / ED Course  I have reviewed the triage vital signs and the nursing notes.  Pertinent labs & imaging results that were available during my  care of the patient were reviewed by me and considered in my medical decision making (see chart for details).     32 year old female presents to the emergency department for evaluation of persistent shortness of breath.  [facility] after she had no improvement with 3 DuoNeb treatments, Solu-Medrol.  Chest x-ray performed prior to arrival concerning for lingular pneumonia.  Patient does report preceding nasal congestion as well as a cough productive of green phlegm.  Treatment supplemented in the ED with Claritin, IV magnesium, IV azithromycin.  The patient received Percocet for chest pain with coughing.  DuoNeb given for chest tightness.  Patient with moments of hypoxia while in the ED with saturations dropping to the high 80s.  She is visibly tachypneic at rest, though this is mild.  It worsens with exertion.  Clinically, I think observation admission would be best given degree of asthma exacerbation prior to arrival with persistent desaturations.  Case discussed with Dr. Clyde LundborgNiu who will admit.   Final Clinical Impressions(s) / ED Diagnoses   Final diagnoses:  Community acquired pneumonia of left lower lobe of lung (HCC)  Moderate persistent asthma with exacerbation    ED Discharge  Orders    None       Antony MaduraHumes, Eddi Hymes, PA-C 08/19/17 0406    Zadie RhineWickline, Donald, MD 08/19/17 [phone]

## 2017-08-19 NOTE — Progress Notes (Signed)
Patient admitted after midnight, please see H&P.  Slowly improving but still markedly SOB when moving. Await further studies. Taylor CanaryJessica Vann DO

## 2017-08-19 NOTE — ED Notes (Signed)
Primary RN Madalyn Rob(Jennifer Gloster) aware of Lactic 2.9

## 2017-08-19 NOTE — H&P (Signed)
History and Physical    [name] DOB: [birth year] DOA: 08/18/2017  Referring MD/NP/PA:   PCP: Sherren Mocha, MD   Patient coming from:  The patient is coming from home.  At baseline, pt is independent for most of ADL.   Chief Complaint: Cough, shortness breath  HPI: [name] is a 32 y.o. female with medical history significant of asthma, obesity, tobacco abuse, who presents with cough, shortness of breath.  Patient states that she has been having cough and shortness of breath for 4 days, which has worsened today.  She coughs up greenish colored sputum.  She does not have chest pain at rest, but coughing induced some pleuritic chest pain.  She has chills, but no fever.  No recent long distance traveling, no calf tenderness.  Patient denies nausea, vomiting, diarrhea, abdominal pain, symptoms of UTI.  No unilateral weakness.  Patient was seen in urgent care, and had chest x-ray done there, which showed streak of infiltration in the lingular lobe for possible pneumonia. She was given Rocephin injection and sent to ED for further evaluation treatment.  ED Course: pt was found to have WBC 14.6, lactic acid 2.9, negative troponin, negative pregnancy test, electrolytes renal function okay.  Tachycardia, tachypnea, oxygen saturation 88% on room air, temperature normal.  Patient is placed on telemetry bed for observation.  Review of Systems:   General: no fevers, has chills, no body weight gain, has fatigue HEENT: no blurry vision, hearing changes or sore throat Respiratory: has dyspnea, coughing, no wheezing (per EDP, pt had mild wheezing initially in ED). CV: has chest pain, no palpitations GI: no nausea, vomiting, abdominal pain, diarrhea, constipation GU: no dysuria, burning on urination, increased urinary frequency, hematuria  Ext: no leg edema Neuro: no unilateral weakness, numbness, or tingling, no vision change or hearing loss Skin: no rash, no skin tear. MSK: No  muscle spasm, no deformity, no limitation of range of movement in spin Heme: No easy bruising.  Travel history: No recent long distant travel.  Allergy: No Known Allergies  Past Medical History:  Diagnosis Date  . Allergy   . Asthma   . Seasonal allergies     History reviewed. No pertinent surgical history.  Social History:  reports that she has been smoking cigarettes.  She has a 2.00 pack-year smoking history. She has never used smokeless tobacco. She reports that she drinks about 2.4 oz of alcohol per week. She reports that she does not use drugs.  Family History:  Family History  Problem Relation Age of Onset  . Cancer Father        prostate   . Liver disease Father   . Liver disease Maternal Grandfather      Prior to Admission medications   Medication Sig Start Date End Date Taking? Authorizing Provider  acetaminophen (TYLENOL) 325 MG tablet Take 650 mg by mouth every 6 (six) hours as needed for pain.    [provider]  azithromycin (ZITHROMAX) 250 MG tablet Two tablets daily x 5 days 08/18/17   Ethelda Chick, MD  Dulaglutide (TRULICITY) 1.5 MG/0.5ML SOPN Inject into the skin.    [provider]  fluticasone (FLONASE) 50 MCG/ACT nasal spray Place 2 sprays into both nostrils at bedtime. 12/30/15   Sherren Mocha, MD  Phentermine-Topiramate (QSYMIA) 15-92 MG CP24 Take by mouth.    [provider]  predniSONE (DELTASONE) 20 MG tablet Take 3 PO QAM x 3 day, 2 PO QAM x 3  days, 1 PO QAM x 3 days 08/18/17   Ethelda ChickSmith, Kristi M, MD    Physical Exam: Vitals:   08/19/17 0330 08/19/17 0400 08/19/17 0430 08/19/17 0445  BP: 128/62 (!) 142/70 127/89 122/72  Pulse: (!) 115 (!) 104 (!) 108 (!) 116  Resp:      Temp:      TempSrc:      SpO2: 90% 93% 94% 94%  Weight:      Height:       General: Not in acute distress HEENT:       Eyes: PERRL, EOMI, no scleral icterus.       ENT: No discharge from the ears and nose, no pharynx injection, no tonsillar  enlargement.        Neck: No JVD, no bruit, no mass felt. Heme: No neck lymph node enlargement. Cardiac: S1/S2, RRR, No murmurs, No gallops or rubs. Respiratory: Good air movement bilaterally. No rales, wheezing, rhonchi or rubs. GI: Soft, nondistended, nontender, no rebound pain, no organomegaly, BS present. GU: No hematuria Ext: No pitting leg edema bilaterally. 2+DP/PT pulse bilaterally. Musculoskeletal: No joint deformities, No joint redness or warmth, no limitation of ROM in spin. Skin: No rashes.  Neuro: Alert, oriented X3, cranial nerves II-XII grossly intact, moves all extremities normally. Muscle strength 5/5 in all extremities, sensation to light touch intact. Brachial reflex 2+ bilaterally. Knee reflex 1+ bilaterally. Negative Babinski's sign. Normal finger to nose test. Psych: Patient is not psychotic, no suicidal or hemocidal ideation.  Labs on Admission: I have personally reviewed following labs and imaging studies  CBC: Recent Labs  Lab 08/18/17 1405 08/18/17 1655  WBC 12.9* 14.6*  NEUTROABS 10.3*  --   HGB 13.8 13.5  HCT 41.0 40.7  MCV 84 84.3  PLT 280 272   Basic Metabolic Panel: Recent Labs  Lab 08/18/17 1405 08/18/17 1655  NA 139 139  K 4.1 3.7  CL 101 106  CO2 20 23  GLUCOSE 93 125*  BUN 6 5*  CREATININE 0.62 0.66  CALCIUM 9.5 9.3   GFR: Estimated Creatinine Clearance: 116 mL/min (by C-G formula based on SCr of 0.66 mg/dL). Liver Function Tests: Recent Labs  Lab 08/18/17 1405  AST 13  ALT 15  ALKPHOS 76  BILITOT 0.4  PROT 7.7  ALBUMIN 4.9   No results for input(s): LIPASE, AMYLASE in the last 168 hours. No results for input(s): AMMONIA in the last 168 hours. Coagulation Profile: No results for input(s): INR, PROTIME in the last 168 hours. Cardiac Enzymes: No results for input(s): CKTOTAL, CKMB, CKMBINDEX, TROPONINI in the last 168 hours. BNP (last 3 results) No results for input(s): PROBNP in the last 8760 hours. HbA1C: No results for  input(s): HGBA1C in the last 72 hours. CBG: No results for input(s): GLUCAP in the last 168 hours. Lipid Profile: No results for input(s): CHOL, HDL, LDLCALC, TRIG, CHOLHDL, LDLDIRECT in the last 72 hours. Thyroid Function Tests: No results for input(s): TSH, T4TOTAL, FREET4, T3FREE, THYROIDAB in the last 72 hours. Anemia Panel: No results for input(s): VITAMINB12, FOLATE, FERRITIN, TIBC, IRON, RETICCTPCT in the last 72 hours. Urine analysis:    Component Value Date/Time   BILIRUBINUR negative 07/16/2017 0905   BILIRUBINUR small 10/17/2014 1009   KETONESUR negative 07/16/2017 0905   PROTEINUR negative 07/16/2017 0905   PROTEINUR neg 10/17/2014 1009   UROBILINOGEN 0.2 07/16/2017 0905   NITRITE Negative 07/16/2017 0905   NITRITE neg 10/17/2014 1009   LEUKOCYTESUR Negative 07/16/2017 0905   Sepsis Labs: @  LABRCNTIP(procalcitonin:4,lacticidven:4) )No results found for this or any previous visit (from the past 240 hour(s)).   Radiological Exams on Admission: Dg Chest 2 View  Result Date: 08/18/2017 CLINICAL DATA:  Cough and shortness of breath. EXAM: CHEST - 2 VIEW COMPARISON:  CT chest and single view of the chest 11/23/2015. FINDINGS: Lung volumes are low. There is streaky airspace disease in the lingula. The lungs are otherwise clear. Heart size is upper normal. No pneumothorax or pleural effusion. IMPRESSION: Streaky airspace disease in the lingula worrisome for pneumonia. Electronically Signed   By: Drusilla Kanner M.D.   On: 08/18/2017 14:40     EKG: Independently reviewed.  Sinus rhythm, tachycardia, QTC 445, LAD, Q waves in lead III/aVF, nonspecific T wave change.    Assessment/Plan Principal Problem:   Acute on chronic respiratory failure with hypoxia (HCC) Active Problems:   Obesity (BMI 30-39.9)   Tobacco use disorder   Asthma exacerbation   Lobar pneumonia (HCC)   Sepsis (HCC)   Acute on chronic respiratory failure with hypoxia due to Asthma exacerbation and  possible lobar pneumonia:  - Will place on telemetry bed for obs. - IV Rocephin and azithromycin - Mucinex for cough  - Atrovent nebs and Xopenex Neb prn for SOB - Urine legionella and S. pneumococcal antigen - Follow up blood culture x2, sputum culture and respiratory virus panel  Sepsis due to Asthma exacerbation and possible lobar pneumonia: Patient meets criteria for sepsis with leukocytosis, tachycardia and tachypnea.  Lactic acid is elevated at 2.9.  Hemodynamically stable. -On IV antibiotics as above - will get Procalcitonin and trend lactic acid level per sepsis protocol - IVF: 3L of NS bolus in ED, followed by 100 mL per hour of NS  Tobacco abuse: -Did counseling about importance of quitting smoking -Nicotine patch  Obesity (BMI 30-39.9): pt is on Trulicity and phentermine- topiramate at home -will hold now   DVT ppx: SQ Lovenox Code Status: Full code Family Communication: None at bed side.      Disposition Plan:  Anticipate discharge back to previous home environment Consults called:  none Admission status: Obs / tele     Date of Service 08/19/2017    Lorretta Harp [facility] Pager [phone]  If 7PM-7AM, please contact night-coverage [email or web address] Password [facility] 08/19/2017, 6:24 AM

## 2017-08-19 NOTE — ED Notes (Signed)
RN observed pt sitting in waiting room w/ O2 tank but was not using it. When brought back to room she asked for a non-rebreather b/c she stated that her nose was running and she needed a "mouth breathing one."

## 2017-08-20 DIAGNOSIS — E669 Obesity, unspecified: Secondary | ICD-10-CM | POA: Diagnosis not present

## 2017-08-20 DIAGNOSIS — J181 Lobar pneumonia, unspecified organism: Secondary | ICD-10-CM | POA: Diagnosis not present

## 2017-08-20 DIAGNOSIS — J9621 Acute and chronic respiratory failure with hypoxia: Secondary | ICD-10-CM | POA: Diagnosis not present

## 2017-08-20 DIAGNOSIS — F17200 Nicotine dependence, unspecified, uncomplicated: Secondary | ICD-10-CM | POA: Diagnosis not present

## 2017-08-20 DIAGNOSIS — J4541 Moderate persistent asthma with (acute) exacerbation: Secondary | ICD-10-CM | POA: Diagnosis not present

## 2017-08-20 LAB — LEGIONELLA PNEUMOPHILA SEROGP 1 UR AG: L. PNEUMOPHILA SEROGP 1 UR AG: NEGATIVE

## 2017-08-20 MED ORDER — RISAQUAD PO CAPS
1.0000 | ORAL_CAPSULE | Freq: Every day | ORAL | Status: DC
  Administered 2017-08-20 – 2017-08-21 (×2): 1 via ORAL
  Filled 2017-08-20 (×2): qty 1

## 2017-08-20 MED ORDER — GUAIFENESIN-DM 100-10 MG/5ML PO SYRP
5.0000 mL | ORAL_SOLUTION | ORAL | Status: DC | PRN
Start: 2017-08-20 — End: 2017-08-21
  Administered 2017-08-20 – 2017-08-21 (×2): 5 mL via ORAL
  Filled 2017-08-20 (×2): qty 5

## 2017-08-20 MED ORDER — METHYLPREDNISOLONE SODIUM SUCC 40 MG IJ SOLR
40.0000 mg | Freq: Two times a day (BID) | INTRAMUSCULAR | Status: DC
  Administered 2017-08-20 – 2017-08-21 (×2): 40 mg via INTRAVENOUS
  Filled 2017-08-20 (×2): qty 1

## 2017-08-20 NOTE — Progress Notes (Signed)
MD notified of patient requesting cough syrup.

## 2017-08-20 NOTE — Progress Notes (Signed)
MD notified of patient requesting a probiotic to prevent getting a yeast infection since she is on antibiotics.

## 2017-08-20 NOTE — Progress Notes (Signed)
SATURATION QUALIFICATIONS: (This note is used to comply with regulatory documentation for home oxygen)  Patient Saturations on Room Air at Rest = 94%  Patient Saturations on Room Air while Ambulating = 90%    

## 2017-08-20 NOTE — Progress Notes (Signed)
Progress Note    [name]  ONG:[id number] DOB: [birth year]  DOA: 08/18/2017 PCP: Sherren Mocha, MD    Brief Narrative:    Medical records reviewed and are as summarized below:  [name] is an 32 y.o. female with medical history significant of asthma, obesity, tobacco abuse, who presents with cough, shortness of breath.  Patient states that she has been having cough and shortness of breath for 4 days, which has worsened today.  She coughs up greenish colored sputum.  She does not have chest pain at rest, but coughing induced some pleuritic chest pain.  She has chills, but no fever.  No recent long distance traveling, no calf tenderness.  Patient denies nausea, vomiting, diarrhea, abdominal pain, symptoms of UTI.  No unilateral weakness.  Patient was seen in urgent care, and had chest x-ray done there, which showed streak of infiltration in the lingular lobe for possible pneumonia.     Assessment/Plan:   Principal Problem:   Acute on chronic respiratory failure with hypoxia (HCC) Active Problems:   Obesity (BMI 30-39.9)   Tobacco use disorder   Asthma exacerbation   Lobar pneumonia (HCC)   Sepsis (HCC)  Acute on chronic respiratory failure with hypoxia due to Asthma exacerbation and possible lobar pneumonia plus rhinovirus: - IV Rocephin and azithromycin - Mucinex for cough  - Atrovent nebs and Xopenex Neb prn for SOB - Urine legionella and S. pneumococcal antigen negative - +coronavirus -wean steroids as able  Tobacco abuse: -Did counseling about importance of quitting smoking -Nicotine patch  Obesity (BMI 30-39.9): pt is on Trulicity and phentermine- topiramate at home -will hold now Body mass index is 38.44 kg/m.   Family Communication/Anticipated D/C date and plan/Code Status   DVT prophylaxis: Lovenox ordered. Code Status: Full Code.  Family Communication:  Disposition Plan: home 24-48 hours   Medical Consultants:     None.    Subjective:   Still gets very winded walking around  Objective:    Vitals:   08/19/17 2112 08/19/17 2214 08/20/17 0543 08/20/17 1431  BP:  131/72 90/67 127/77  Pulse:  85 75 88  Resp:  19 18 20   Temp:  98.4 F (36.9 C) 98.3 F (36.8 C) 98.1 F (36.7 C)  TempSrc:  Oral Oral Oral  SpO2: 97% 96% 96% 94%  Weight:      Height:        Intake/Output Summary (Last 24 hours) at 08/20/2017 1548 Last data filed at 08/20/2017 1432 Gross per 24 hour  Intake 1889.67 ml  Output 1200 ml  Net 689.67 ml   Filed Weights   08/18/17 1632 08/19/17 0937  Weight: 98.4 kg (217 lb) 98.4 kg (217 lb)    Exam: In bed, mild increase in work of breathing rrr Diminished but no wheezing +BS, soft, obese  Data Reviewed:   I have personally reviewed following labs and imaging studies:  Labs: Labs show the following:   Basic Metabolic Panel: Recent Labs  Lab 08/18/17 1405 08/18/17 1655  NA 139 139  K 4.1 3.7  CL 101 106  CO2 20 23  GLUCOSE 93 125*  BUN 6 5*  CREATININE 0.62 0.66  CALCIUM 9.5 9.3   GFR Estimated Creatinine Clearance: 116 mL/min (by C-G formula based on SCr of 0.66 mg/dL). Liver Function Tests: Recent Labs  Lab 08/18/17 1405  AST 13  ALT 15  ALKPHOS 76  BILITOT 0.4  PROT 7.7  ALBUMIN 4.9   No results for  input(s): LIPASE, AMYLASE in the last 168 hours. No results for input(s): AMMONIA in the last 168 hours. Coagulation profile No results for input(s): INR, PROTIME in the last 168 hours.  CBC: Recent Labs  Lab 08/18/17 1405 08/18/17 1655  WBC 12.9* 14.6*  NEUTROABS 10.3*  --   HGB 13.8 13.5  HCT 41.0 40.7  MCV 84 84.3  PLT 280 272   Cardiac Enzymes: No results for input(s): CKTOTAL, CKMB, CKMBINDEX, TROPONINI in the last 168 hours. BNP (last 3 results) No results for input(s): PROBNP in the last 8760 hours. CBG: No results for input(s): GLUCAP in the last 168 hours. D-Dimer: No results for input(s): DDIMER in the last 72  hours. Hgb A1c: No results for input(s): HGBA1C in the last 72 hours. Lipid Profile: No results for input(s): CHOL, HDL, LDLCALC, TRIG, CHOLHDL, LDLDIRECT in the last 72 hours. Thyroid function studies: No results for input(s): TSH, T4TOTAL, T3FREE, THYROIDAB in the last 72 hours.  Invalid input(s): FREET3 Anemia work up: No results for input(s): VITAMINB12, FOLATE, FERRITIN, TIBC, IRON, RETICCTPCT in the last 72 hours. Sepsis Labs: Recent Labs  Lab 08/18/17 1405 08/18/17 1655 08/19/17 0444 08/19/17 0450 08/19/17 0835  PROCALCITON  --   --   --  <0.10  --   WBC 12.9* 14.6*  --   --   --   LATICACIDVEN  --   --  2.9*  --  2.5*    Microbiology Recent Results (from the past 240 hour(s))  Culture, blood (x 2)     Status: None (Preliminary result)   Collection Time: 08/19/17  4:41 AM  Result Value Ref Range Status   Specimen Description BLOOD RIGHT HAND  Final   Special Requests   Final    BOTTLES DRAWN AEROBIC AND ANAEROBIC Blood Culture adequate volume   Culture   Final    NO GROWTH 1 DAY Performed at [facility], [address] [place], [place] [address]    Report Status PENDING  Incomplete  Culture, blood (x 2)     Status: None (Preliminary result)   Collection Time: 08/19/17  4:56 AM  Result Value Ref Range Status   Specimen Description BLOOD LEFT ANTECUBITAL  Final   Special Requests   Final    BOTTLES DRAWN AEROBIC AND ANAEROBIC Blood Culture adequate volume   Culture   Final    NO GROWTH 1 DAY Performed at [facility], [address] [place], [place] [address]    Report Status PENDING  Incomplete  Respiratory Panel by PCR     Status: Abnormal   Collection Time: 08/19/17 10:15 AM  Result Value Ref Range Status   Adenovirus NOT DETECTED NOT DETECTED Final   Coronavirus 229E NOT DETECTED NOT DETECTED Final   Coronavirus HKU1 NOT DETECTED NOT DETECTED Final   Coronavirus NL63 NOT DETECTED NOT DETECTED Final   Coronavirus OC43 NOT DETECTED NOT  DETECTED Final   Metapneumovirus NOT DETECTED NOT DETECTED Final   Rhinovirus / Enterovirus DETECTED (A) NOT DETECTED Final   Influenza A NOT DETECTED NOT DETECTED Final   Influenza B NOT DETECTED NOT DETECTED Final   Parainfluenza Virus 1 NOT DETECTED NOT DETECTED Final   Parainfluenza Virus 2 NOT DETECTED NOT DETECTED Final   Parainfluenza Virus 3 NOT DETECTED NOT DETECTED Final   Parainfluenza Virus 4 NOT DETECTED NOT DETECTED Final   Respiratory Syncytial Virus NOT DETECTED NOT DETECTED Final   Bordetella pertussis NOT DETECTED NOT DETECTED Final  Chlamydophila pneumoniae NOT DETECTED NOT DETECTED Final   Mycoplasma pneumoniae NOT DETECTED NOT DETECTED Final    Procedures and diagnostic studies:  No results found.  Medications:   . acidophilus  1 capsule Oral Daily  . enoxaparin (LOVENOX) injection  40 mg Subcutaneous Daily  . fluticasone  2 spray Each Nare QHS  . loratadine  10 mg Oral Daily  . methylPREDNISolone (SOLU-MEDROL) injection  60 mg Intravenous Q12H  . nicotine  21 mg Transdermal Daily   Continuous Infusions: . azithromycin Stopped (08/20/17 0507)  . cefTRIAXone (ROCEPHIN)  IV Stopped (08/20/17 1014)     LOS: 0 days   Joseph Art  [facility]   *Please refer to [email or web address], password [email or web address] to get updated schedule on who will round on this patient, as hospitalists switch teams weekly. If 7PM-7AM, please contact night-coverage at [email or web address], password [email or web address] for any overnight needs.  08/20/2017, 3:48 PM

## 2017-08-21 DIAGNOSIS — J4541 Moderate persistent asthma with (acute) exacerbation: Secondary | ICD-10-CM | POA: Diagnosis not present

## 2017-08-21 DIAGNOSIS — E669 Obesity, unspecified: Secondary | ICD-10-CM | POA: Diagnosis not present

## 2017-08-21 DIAGNOSIS — J181 Lobar pneumonia, unspecified organism: Secondary | ICD-10-CM | POA: Diagnosis not present

## 2017-08-21 DIAGNOSIS — F17200 Nicotine dependence, unspecified, uncomplicated: Secondary | ICD-10-CM

## 2017-08-21 LAB — BASIC METABOLIC PANEL
Anion gap: 6 (ref 5–15)
BUN: 11 mg/dL (ref 6–20)
CHLORIDE: 110 mmol/L (ref 101–111)
CO2: 22 mmol/L (ref 22–32)
CREATININE: 0.63 mg/dL (ref 0.44–1.00)
Calcium: 8.4 mg/dL — ABNORMAL LOW (ref 8.9–10.3)
GFR calc Af Amer: >60 mL/min (ref >60)
GFR calc non Af Amer: >60 mL/min (ref >60)
GLUCOSE: 132 mg/dL — AB (ref 65–99)
Potassium: 3.9 mmol/L (ref 3.5–5.1)
Sodium: 138 mmol/L (ref 135–145)

## 2017-08-21 LAB — CBC
HEMATOCRIT: 35.2 % — AB (ref 36.0–46.0)
HEMOGLOBIN: 11.9 g/dL — AB (ref 12.0–15.0)
MCH: 28.1 pg (ref 26.0–34.0)
MCHC: 33.8 g/dL (ref 30.0–36.0)
MCV: 83 fL (ref 78.0–100.0)
Platelets: 266 10*3/uL (ref 150–400)
RBC: 4.24 MIL/uL (ref 3.87–5.11)
RDW: 14.1 % (ref 11.5–15.5)
WBC: 12 10*3/uL — ABNORMAL HIGH (ref 4.0–10.5)

## 2017-08-21 MED ORDER — ONDANSETRON HCL 4 MG PO TABS: 4.0000 mg | ORAL_TABLET | Freq: Three times a day (TID) | ORAL | 0 refills | Status: DC | PRN

## 2017-08-21 MED ORDER — GUAIFENESIN-DM 100-10 MG/5ML PO SYRP: 5.0000 mL | ORAL_SOLUTION | ORAL | 0 refills | Status: DC | PRN

## 2017-08-21 MED ORDER — FLUCONAZOLE 150 MG PO TABS: 150.0000 mg | ORAL_TABLET | Freq: Once | ORAL | 0 refills | Status: AC

## 2017-08-21 MED ORDER — PREDNISONE 20 MG PO TABS: 40.0000 mg | ORAL_TABLET | Freq: Every day | ORAL | Status: DC

## 2017-08-21 MED ORDER — AZITHROMYCIN 500 MG PO TABS: 500.0000 mg | ORAL_TABLET | Freq: Every day | ORAL | Status: DC

## 2017-08-21 MED ORDER — ONDANSETRON HCL 4 MG PO TABS
4.0000 mg | ORAL_TABLET | Freq: Three times a day (TID) | ORAL | Status: DC | PRN
  Administered 2017-08-21: 4 mg via ORAL
  Filled 2017-08-21: qty 1

## 2017-08-21 MED ORDER — LEVOFLOXACIN 500 MG PO TABS: 500.0000 mg | ORAL_TABLET | Freq: Every day | ORAL | 0 refills | Status: AC

## 2017-08-21 MED ORDER — RISAQUAD PO CAPS: 1.0000 | ORAL_CAPSULE | Freq: Every day | ORAL | 0 refills | Status: DC

## 2017-08-21 MED ORDER — PREDNISONE 20 MG PO TABS: 40.0000 mg | ORAL_TABLET | Freq: Every day | ORAL | 0 refills | Status: DC

## 2017-08-21 NOTE — Discharge Summary (Addendum)
Physician Discharge Summary  Taylor Crutchbony P Alphin WUJ:[id number] DOB: 01/24/1986 DOA: 08/18/2017  PCP: Sherren MochaShaw, Eva N, MD  Admit date: 08/18/2017 Discharge date: 08/21/2017  Admitted From: home Discharge disposition: home   Recommendations for Outpatient Follow-Up:   1. Smoking cessation   Discharge Diagnosis:   Principal Problem:   Acute on chronic respiratory failure with hypoxia (HCC) Active Problems:   Obesity (BMI 30-39.9)   Tobacco use disorder   Asthma exacerbation   Lobar pneumonia (HCC)    Discharge Condition: Improved.  Diet recommendation:  Regular.  Wound care: None.  Code status: Full.   History of Present Illness:   [name] is a 32 y.o. female with medical history significant of asthma, obesity, tobacco abuse, who presents with cough, shortness of breath.  Patient states that she has been having cough and shortness of breath for 4 days, which has worsened today.  She coughs up greenish colored sputum.  She does not have chest pain at rest, but coughing induced some pleuritic chest pain.  She has chills, but no fever.  No recent long distance traveling, no calf tenderness.  Patient denies nausea, vomiting, diarrhea, abdominal pain, symptoms of UTI.  No unilateral weakness.  Patient was seen in urgent care, and had chest x-ray done there, which showed streak of infiltration in the lingular lobe for possible pneumonia. She was given Rocephin injection and sent to ED for further evaluation treatment.     Hospital Course by Problem:   Acute on chronic respiratory failure with hypoxiadue toAsthma exacerbationand possible lobar pneumonia plus rhinovirus: - IV Rocephin and azithromycin- change to PO levaquin for duration of treatment - Mucinex for cough  - Urine legionella and S. pneumococcal antigen negative - +coronavirus -steroid burst  Tobacco abuse: -Did counseling about importance of quitting smoking -Nicotine patch- patient  declined  Obesity (BMI 30-39.9): pt is onTrulicity and phentermine- topiramateat home -resume upon d/c Body mass index is 38.44 kg/m.      Medical Consultants:      Discharge Exam:   Vitals:   08/21/17 1150 08/21/17 1222  BP: (!) 146/75   Pulse: (!) 59   Resp: 20   Temp: 98.6 F (37 C)   SpO2: 100% 98%   Vitals:   08/21/17 0529 08/21/17 0800 08/21/17 1150 08/21/17 1222  BP: 133/64 118/83 (!) 146/75   Pulse: 64 (!) 57 (!) 59   Resp: 17 18 20    Temp: 97.9 F (36.6 C) 98.2 F (36.8 C) 98.6 F (37 C)   TempSrc: Oral Oral Oral   SpO2: 100% 95% 100% 98%  Weight:      Height:        General exam: Appears calm and comfortable-- breathing much better    The results of significant diagnostics from this hospitalization (including imaging, microbiology, ancillary and laboratory) are listed below for reference.     Procedures and Diagnostic Studies:   Dg Chest 2 View  Result Date: 08/18/2017 CLINICAL DATA:  Cough and shortness of breath. EXAM: CHEST - 2 VIEW COMPARISON:  CT chest and single view of the chest 11/23/2015. FINDINGS: Lung volumes are low. There is streaky airspace disease in the lingula. The lungs are otherwise clear. Heart size is upper normal. No pneumothorax or pleural effusion. IMPRESSION: Streaky airspace disease in the lingula worrisome for pneumonia. Electronically Signed   By: Drusilla Kannerhomas  Dalessio M.D.   On: 08/18/2017 14:40     Labs:   Basic Metabolic Panel: Recent Labs  Lab 08/18/17 1405 08/18/17 1655 08/21/17 0638  NA 139 139 138  K 4.1 3.7 3.9  CL 101 106 110  CO2 20 23 22   GLUCOSE 93 125* 132*  BUN 6 5* 11  CREATININE 0.62 0.66 0.63  CALCIUM 9.5 9.3 8.4*   GFR Estimated Creatinine Clearance: 116 mL/min (by C-G formula based on SCr of 0.63 mg/dL). Liver Function Tests: Recent Labs  Lab 08/18/17 1405  AST 13  ALT 15  ALKPHOS 76  BILITOT 0.4  PROT 7.7  ALBUMIN 4.9   No results for input(s): LIPASE, AMYLASE in the last 168  hours. No results for input(s): AMMONIA in the last 168 hours. Coagulation profile No results for input(s): INR, PROTIME in the last 168 hours.  CBC: Recent Labs  Lab 08/18/17 1405 08/18/17 1655 08/21/17 0638  WBC 12.9* 14.6* 12.0*  NEUTROABS 10.3*  --   --   HGB 13.8 13.5 11.9*  HCT 41.0 40.7 35.2*  MCV 84 84.3 83.0  PLT 280 272 266   Cardiac Enzymes: No results for input(s): CKTOTAL, CKMB, CKMBINDEX, TROPONINI in the last 168 hours. BNP: Invalid input(s): POCBNP CBG: No results for input(s): GLUCAP in the last 168 hours. D-Dimer No results for input(s): DDIMER in the last 72 hours. Hgb A1c No results for input(s): HGBA1C in the last 72 hours. Lipid Profile No results for input(s): CHOL, HDL, LDLCALC, TRIG, CHOLHDL, LDLDIRECT in the last 72 hours. Thyroid function studies No results for input(s): TSH, T4TOTAL, T3FREE, THYROIDAB in the last 72 hours.  Invalid input(s): FREET3 Anemia work up No results for input(s): VITAMINB12, FOLATE, FERRITIN, TIBC, IRON, RETICCTPCT in the last 72 hours. Microbiology Recent Results (from the past 240 hour(s))  Culture, blood (x 2)     Status: None (Preliminary result)   Collection Time: 08/19/17  4:41 AM  Result Value Ref Range Status   Specimen Description BLOOD RIGHT HAND  Final   Special Requests   Final    BOTTLES DRAWN AEROBIC AND ANAEROBIC Blood Culture adequate volume   Culture   Final    NO GROWTH 2 DAYS Performed at [facility], [address], [place], [place] [address]    Report Status PENDING  Incomplete  Culture, blood (x 2)     Status: None (Preliminary result)   Collection Time: 08/19/17  4:56 AM  Result Value Ref Range Status   Specimen Description BLOOD LEFT ANTECUBITAL  Final   Special Requests   Final    BOTTLES DRAWN AEROBIC AND ANAEROBIC Blood Culture adequate volume   Culture   Final    NO GROWTH 2 DAYS Performed at [facility], [address], [place], [place] [address]    Report Status  PENDING  Incomplete  Respiratory Panel by PCR     Status: Abnormal   Collection Time: 08/19/17 10:15 AM  Result Value Ref Range Status   Adenovirus NOT DETECTED NOT DETECTED Final   Coronavirus 229E NOT DETECTED NOT DETECTED Final   Coronavirus HKU1 NOT DETECTED NOT DETECTED Final   Coronavirus NL63 NOT DETECTED NOT DETECTED Final   Coronavirus OC43 NOT DETECTED NOT DETECTED Final   Metapneumovirus NOT DETECTED NOT DETECTED Final   Rhinovirus / Enterovirus DETECTED (A) NOT DETECTED Final   Influenza A NOT DETECTED NOT DETECTED Final   Influenza B NOT DETECTED NOT DETECTED Final   Parainfluenza Virus 1 NOT DETECTED NOT DETECTED Final   Parainfluenza Virus 2 NOT DETECTED NOT DETECTED Final   Parainfluenza Virus 3  NOT DETECTED NOT DETECTED Final   Parainfluenza Virus 4 NOT DETECTED NOT DETECTED Final   Respiratory Syncytial Virus NOT DETECTED NOT DETECTED Final   Bordetella pertussis NOT DETECTED NOT DETECTED Final   Chlamydophila pneumoniae NOT DETECTED NOT DETECTED Final   Mycoplasma pneumoniae NOT DETECTED NOT DETECTED Final     Discharge Instructions:   Discharge Instructions    Diet general   Complete by:  As directed    Discharge instructions   Complete by:  As directed    Stop smoking   Increase activity slowly   Complete by:  As directed      Allergies as of 08/21/2017   No Known Allergies     Medication List    STOP taking these medications   azithromycin 250 MG tablet Commonly known as:  ZITHROMAX     TAKE these medications   acetaminophen 325 MG tablet Commonly known as:  TYLENOL Take 650 mg by mouth every 6 (six) hours as needed for pain.   acidophilus Caps capsule Take 1 capsule by mouth daily. Start taking on:  08/22/2017   BENGAY COLD THERAPY EX Apply 1 application topically as needed (muscle cramp).   cetirizine 10 MG chewable tablet Commonly known as:  ZYRTEC Chew 10 mg by mouth daily. Notes to patient:  Resume home regimen    fluconazole  150 MG tablet Commonly known as:  DIFLUCAN Take 1 tablet (150 mg total) by mouth once for 1 dose. After abx for yeast infection   fluticasone 50 MCG/ACT nasal spray Commonly known as:  FLONASE Place 2 sprays into both nostrils at bedtime.   guaiFENesin-dextromethorphan 100-10 MG/5ML syrup Commonly known as:  ROBITUSSIN DM Take 5 mLs by mouth every 4 (four) hours as needed for cough.   levofloxacin 500 MG tablet Commonly known as:  LEVAQUIN Take 1 tablet (500 mg total) by mouth daily for 2 days.   ondansetron 4 MG tablet Commonly known as:  ZOFRAN Take 1 tablet (4 mg total) by mouth every 8 (eight) hours as needed for nausea or vomiting.   predniSONE 20 MG tablet Commonly known as:  DELTASONE Take 2 tablets (40 mg total) by mouth daily with breakfast. For 3 days and then stop Start taking on:  08/22/2017 What changed:    how much to take  how to take this  when to take this  additional instructions   QSYMIA 15-92 MG Cp24 Generic drug:  Phentermine-Topiramate Take 1 tablet by mouth daily. Notes to patient:  Resume home regimen    TRULICITY 1.5 MG/0.5ML Sopn Generic drug:  Dulaglutide Inject 1.5 mg into the skin once a week. Notes to patient:  Resume home regimen       Follow-up Information    Sherren Mocha, MD Follow up in 1 week(s).   Specialty:  Family Medicine Contact information: [address] [place] [address] [phone]            Time coordinating discharge: 35 min Signed:  Joseph Art  [facility] 08/21/2017, 3:43 PM

## 2017-08-24 LAB — CULTURE, BLOOD (ROUTINE X 2)
CULTURE: NO GROWTH
Culture: NO GROWTH
Special Requests: ADEQUATE
Special Requests: ADEQUATE

## 2017-09-30 ENCOUNTER — Ambulatory Visit: Payer: Self-pay | Admitting: Family Medicine

## 2017-09-30 ENCOUNTER — Other Ambulatory Visit: Payer: Self-pay

## 2017-09-30 ENCOUNTER — Encounter: Payer: Self-pay | Admitting: Emergency Medicine

## 2017-09-30 ENCOUNTER — Ambulatory Visit (INDEPENDENT_AMBULATORY_CARE_PROVIDER_SITE_OTHER): Payer: BC Managed Care – PPO

## 2017-09-30 ENCOUNTER — Ambulatory Visit: Payer: BC Managed Care – PPO | Admitting: Emergency Medicine

## 2017-09-30 VITALS — BP 106/63 | HR 101 | Temp 99.3°F | Resp 18 | Ht 62.25 in | Wt 221.0 lb

## 2017-09-30 DIAGNOSIS — R062 Wheezing: Secondary | ICD-10-CM | POA: Insufficient documentation

## 2017-09-30 DIAGNOSIS — R05 Cough: Secondary | ICD-10-CM | POA: Diagnosis not present

## 2017-09-30 DIAGNOSIS — R059 Cough, unspecified: Secondary | ICD-10-CM | POA: Insufficient documentation

## 2017-09-30 DIAGNOSIS — Z8709 Personal history of other diseases of the respiratory system: Secondary | ICD-10-CM | POA: Insufficient documentation

## 2017-09-30 DIAGNOSIS — R0602 Shortness of breath: Secondary | ICD-10-CM

## 2017-09-30 DIAGNOSIS — J22 Unspecified acute lower respiratory infection: Secondary | ICD-10-CM | POA: Diagnosis not present

## 2017-09-30 MED ORDER — ALBUTEROL SULFATE (2.5 MG/3ML) 0.083% IN NEBU
2.5000 mg | INHALATION_SOLUTION | Freq: Once | RESPIRATORY_TRACT | Status: AC
  Administered 2017-09-30: 2.5 mg via RESPIRATORY_TRACT

## 2017-09-30 MED ORDER — PREDNISONE 20 MG PO TABS
40.0000 mg | ORAL_TABLET | Freq: Every day | ORAL | 0 refills | Status: AC
Start: 2017-09-30 — End: 2017-10-05

## 2017-09-30 MED ORDER — CEFTRIAXONE SODIUM 1 G IJ SOLR
1.0000 g | Freq: Once | INTRAMUSCULAR | Status: AC
  Administered 2017-09-30: 1 g via INTRAMUSCULAR

## 2017-09-30 MED ORDER — FLUCONAZOLE 150 MG PO TABS: 150.0000 mg | ORAL_TABLET | Freq: Once | ORAL | 0 refills | Status: AC

## 2017-09-30 MED ORDER — AZITHROMYCIN 250 MG PO TABS: ORAL_TABLET | ORAL | 0 refills | Status: DC

## 2017-09-30 MED ORDER — IPRATROPIUM BROMIDE 0.02 % IN SOLN
0.5000 mg | Freq: Once | RESPIRATORY_TRACT | Status: AC
  Administered 2017-09-30: 0.5 mg via RESPIRATORY_TRACT

## 2017-09-30 MED ORDER — ALBUTEROL SULFATE HFA 108 (90 BASE) MCG/ACT IN AERS: 2.0000 | INHALATION_SPRAY | Freq: Four times a day (QID) | RESPIRATORY_TRACT | 0 refills | Status: DC | PRN

## 2017-09-30 NOTE — Progress Notes (Signed)
[name] 31 y.o.   Chief Complaint  Patient presents with  . Shortness of Breath    x 3 days, IP at [facility] x 5 days for pneumonia 08/18/2017    HISTORY OF PRESENT ILLNESS: This is a 32 y.o. female complaining of 5-day history of productive cough with general weakness, difficulty breathing, fever and chills.  Was admitted to the hospital on 08/18/2017 with community-acquired pneumonia.  Has history of asthma.  Non-smoker.  Recently traveled to [place], [place].  HPI   Prior to Admission medications   Medication Sig Start Date End Date Taking? Authorizing Provider  acetaminophen (TYLENOL) 325 MG tablet Take 650 mg by mouth every 6 (six) hours as needed for pain.   Yes [provider]  cetirizine (ZYRTEC) 10 MG chewable tablet Chew 10 mg by mouth daily.   Yes [provider]  fluticasone (FLONASE) 50 MCG/ACT nasal spray Place 2 sprays into both nostrils at bedtime. 12/30/15  Yes Sherren Mocha, MD  guaiFENesin-dextromethorphan (ROBITUSSIN DM) 100-10 MG/5ML syrup Take 5 mLs by mouth every 4 (four) hours as needed for cough. 08/21/17  Yes Vann, Jessica U, DO  Menthol, Topical Analgesic, (BENGAY COLD THERAPY EX) Apply 1 application topically as needed (muscle cramp).   Yes [provider]  predniSONE (DELTASONE) 20 MG tablet Take 2 tablets (40 mg total) by mouth daily with breakfast. For 3 days and then stop 08/22/17  Yes Vann, Jessica U, DO  acidophilus (RISAQUAD) CAPS capsule Take 1 capsule by mouth daily. Patient not taking: Reported on 09/30/2017 08/22/17   Joseph Art, DO  Dulaglutide (TRULICITY) 1.5 MG/0.5ML SOPN Inject 1.5 mg into the skin once a week.     [provider]  ondansetron (ZOFRAN) 4 MG tablet Take 1 tablet (4 mg total) by mouth every 8 (eight) hours as needed for nausea or vomiting. Patient not taking: Reported on 09/30/2017 08/21/17   Joseph Art, DO  Phentermine-Topiramate (QSYMIA) 15-92 MG CP24 Take 1 tablet by mouth daily.      [provider]    No Known Allergies  Patient Active Problem List   Diagnosis Date Noted  . Acute on chronic respiratory failure with hypoxia (HCC) 08/19/2017  . Asthma exacerbation 08/19/2017  . Lobar pneumonia (HCC) 08/19/2017  . Sepsis (HCC) 08/19/2017  . Tobacco use disorder 04/13/2017  . Obesity (BMI 30-39.9) 09/13/2014    Past Medical History:  Diagnosis Date  . Allergy   . Asthma   . Seasonal allergies     Past Surgical History:  Procedure Laterality Date  . DENTAL SURGERY      Social History   Socioeconomic History  . Marital status: Single    Spouse name: Not on file  . Number of children: Not on file  . Years of education: Not on file  . Highest education level: Not on file  Occupational History  . Not on file  Social Needs  . Financial resource strain: Not on file  . Food insecurity:    Worry: Not on file    Inability: Not on file  . Transportation needs:    Medical: Not on file    Non-medical: Not on file  Tobacco Use  . Smoking status: Current Every Day Smoker    Packs/day: 0.50    Years: 4.00    Pack years: 2.00    Types: Cigarettes  . Smokeless tobacco: Never Used  Substance and Sexual Activity  . Alcohol use: Yes    Alcohol/week: 2.4  oz    Types: 2 Glasses of wine, 2 Standard drinks or equivalent per week  . Drug use: No  . Sexual activity: Yes    Birth control/protection: Implant  Lifestyle  . Physical activity:    Days per week: Not on file    Minutes per session: Not on file  . Stress: Not on file  Relationships  . Social connections:    Talks on phone: Not on file    Gets together: Not on file    Attends religious service: Not on file    Active member of club or organization: Not on file    Attends meetings of clubs or organizations: Not on file    Relationship status: Not on file  . Intimate partner violence:    Fear of current or ex partner: Not on file    Emotionally abused: Not on file    Physically abused:  Not on file    Forced sexual activity: Not on file  Other Topics Concern  . Not on file  Social History Narrative  . Not on file    Family History  Problem Relation Age of Onset  . Cancer Father        prostate   . Liver disease Father   . Liver disease Maternal Grandfather      Review of Systems  Constitutional: Positive for chills, fever and malaise/fatigue.  HENT: Positive for congestion. Negative for sore throat.   Eyes: Negative.  Negative for discharge and redness.  Respiratory: Positive for cough, sputum production, shortness of breath and wheezing. Negative for hemoptysis.   Cardiovascular: Negative.  Negative for chest pain, palpitations and leg swelling.  Gastrointestinal: Negative.  Negative for abdominal pain, blood in stool, diarrhea, melena, nausea and vomiting.  Genitourinary: Negative.  Negative for dysuria and hematuria.  Musculoskeletal: Negative for back pain, myalgias and neck pain.  Skin: Negative.  Negative for rash.  Neurological: Negative.  Negative for dizziness and headaches.  Endo/Heme/Allergies: Negative.   All other systems reviewed and are negative.   Vitals:   09/30/17 1143 09/30/17 1337  BP: 106/63   Pulse: (!) 101 (!) 101  Resp: 16 18  Temp: 99.3 F (37.4 C)   SpO2: 91% 96%     Physical Exam  Constitutional: She is oriented to person, place, and time. She appears well-developed.  Obese.  HENT:  Head: Normocephalic and atraumatic.  Nose: Nose normal.  Mouth/Throat: Oropharynx is clear and moist.  Eyes: Pupils are equal, round, and reactive to light. Conjunctivae and EOM are normal.  Neck: Normal range of motion. Neck supple.  Cardiovascular: Normal rate and regular rhythm.  Pulmonary/Chest: No respiratory distress. She has decreased breath sounds. She has wheezes. She has no rhonchi. She has no rales.  Abdominal: Soft.  Musculoskeletal: Normal range of motion. She exhibits no edema or tenderness.  Lymphadenopathy:    She has no  cervical adenopathy.  Neurological: She is alert and oriented to person, place, and time. No sensory deficit. She exhibits normal muscle tone.  Skin: Skin is warm and dry. Capillary refill takes less than 2 seconds.  Psychiatric: She has a normal mood and affect. Her behavior is normal.  Vitals reviewed.    Dg Chest 2 View  Result Date: 09/30/2017 CLINICAL DATA:  Cough.  Recent episode of pneumonia. EXAM: CHEST - 2 VIEW COMPARISON:  Chest x-ray of August 18, 2017 FINDINGS: The lungs are adequately inflated. The patchy airspace disease noted in the lingula has cleared. There is  subtle increased density in the right upper lobe today. The heart is top-normal in size but stable. The pulmonary vascularity is normal. The mediastinum is normal in width. There is no pleural effusion. The bony thorax is unremarkable. IMPRESSION: Right upper lobe subsegmental atelectasis or early pneumonia. Interval clearing of lingular infiltrate. Follow-up radiographs following anticipated antibiotic therapy are recommended unless the patient's symptoms completely resolve. Electronically Signed   By: David  SwazilandJordan M.D.   On: 09/30/2017 12:24   A total of 40 minutes was spent in the room with the patient, greater than 50% of which was in counseling/coordination of care regarding differential diagnosis, x-ray reviewed, management, medications reevaluation after breathing treatment and need for follow-up in 3 days.  ASSESSMENT & PLAN: Karel Jarvisbony was seen today for shortness of breath.  Diagnoses and all orders for this visit:  Lower resp. tract infection -     albuterol (PROVENTIL) (2.5 MG/3ML) 0.083% nebulizer solution 2.5 mg -     ipratropium (ATROVENT) nebulizer solution 0.5 mg -     DG Chest 2 View; Future -     azithromycin (ZITHROMAX) 250 MG tablet; Sig as indicated -     cefTRIAXone (ROCEPHIN) injection 1 g  Cough  Shortness of breath  History of asthma  Diffuse wheezing -     predniSONE (DELTASONE) 20 MG  tablet; Take 2 tablets (40 mg total) by mouth daily with breakfast for 5 days. -     albuterol (PROVENTIL HFA;VENTOLIN HFA) 108 (90 Base) MCG/ACT inhaler; Inhale 2 puffs into the lungs every 6 (six) hours as needed for wheezing or shortness of breath.    Patient Instructions       IF you received an x-ray today, you will receive an invoice from [facility]. Please contact [facility] at [phone] with questions or concerns regarding your invoice.   IF you received labwork today, you will receive an invoice from [facility]. Please contact [facility] at [phone] with questions or concerns regarding your invoice.   Our billing staff will not be able to assist you with questions regarding bills from these companies.  You will be contacted with the lab results as soon as they are available. The fastest way to get your results is to activate your My Chart account. Instructions are located on the last page of this paperwork. If you have not heard from us regarding the results in 2 weeks, please contact this office.     Community-Acquired Pneumonia, Adult Pneumonia is an infection of the lungs. One type of pneumonia can happen while a person is in a hospital. A different type can happen when a person is not in a hospital (community-acquired pneumonia). It is easy for this kind to spread from person to person. It can spread to you if you breathe near an infected person who coughs or sneezes. Some symptoms include:  A dry cough.  A wet (productive) cough.  Fever.  Sweating.  Chest pain.  Follow these instructions at home:  Take over-the-counter and prescription medicines only as told by your doctor. ? Only take cough medicine if you are losing sleep. ? If you were prescribed an antibiotic medicine, take it as told by your doctor. Do not stop taking the antibiotic even if you start to feel better.  Sleep with your head and neck raised (elevated). You can do this by  putting a few pillows under your head, or you can sleep in a recliner.  Do not use tobacco products. These include cigarettes, chewing tobacco,  and e-cigarettes. If you need help quitting, ask your doctor.  Drink enough water to keep your pee (urine) clear or pale yellow. A shot (vaccine) can help prevent pneumonia. Shots are often suggested for:  People older than 30 years of age.  People older than 30 years of age: ? Who are having cancer treatment. ? Who have long-term (chronic) lung disease. ? Who have problems with their body's defense system (immune system).  You may also prevent pneumonia if you take these actions:  Get the flu (influenza) shot every year.  Go to the dentist as often as told.  Wash your hands often. If soap and water are not available, use hand sanitizer.  Contact a doctor if:  You have a fever.  You lose sleep because your cough medicine does not help. Get help right away if:  You are short of breath and it gets worse.  You have more chest pain.  Your sickness gets worse. This is very serious if: ? You are an older adult. ? Your body's defense system is weak.  You cough up blood. This information is not intended to replace advice given to you by your health care provider. Make sure you discuss any questions you have with your health care provider. Document Released: 08/14/2007 Document Revised: 08/03/2015 Document Reviewed: 06/22/2014 Elsevier Interactive Patient Education  2018 Elsevier Inc.      Edwina Barth, MD [facility]

## 2017-09-30 NOTE — Patient Instructions (Addendum)
     IF you received an x-ray today, you will receive an invoice from [facility]. Please contact [facility] at [phone] with questions or concerns regarding your invoice.   IF you received labwork today, you will receive an invoice from [facility]. Please contact [facility] at [phone] with questions or concerns regarding your invoice.   Our billing staff will not be able to assist you with questions regarding bills from these companies.  You will be contacted with the lab results as soon as they are available. The fastest way to get your results is to activate your My Chart account. Instructions are located on the last page of this paperwork. If you have not heard from us regarding the results in 2 weeks, please contact this office.     Community-Acquired Pneumonia, Adult Pneumonia is an infection of the lungs. One type of pneumonia can happen while a person is in a hospital. A different type can happen when a person is not in a hospital (community-acquired pneumonia). It is easy for this kind to spread from person to person. It can spread to you if you breathe near an infected person who coughs or sneezes. Some symptoms include:  A dry cough.  A wet (productive) cough.  Fever.  Sweating.  Chest pain.  Follow these instructions at home:  Take over-the-counter and prescription medicines only as told by your doctor. ? Only take cough medicine if you are losing sleep. ? If you were prescribed an antibiotic medicine, take it as told by your doctor. Do not stop taking the antibiotic even if you start to feel better.  Sleep with your head and neck raised (elevated). You can do this by putting a few pillows under your head, or you can sleep in a recliner.  Do not use tobacco products. These include cigarettes, chewing tobacco, and e-cigarettes. If you need help quitting, ask your doctor.  Drink enough water to keep your pee (urine) clear or pale yellow. A shot  (vaccine) can help prevent pneumonia. Shots are often suggested for:  People older than 30 years of age.  People older than 30 years of age: ? Who are having cancer treatment. ? Who have long-term (chronic) lung disease. ? Who have problems with their body's defense system (immune system).  You may also prevent pneumonia if you take these actions:  Get the flu (influenza) shot every year.  Go to the dentist as often as told.  Wash your hands often. If soap and water are not available, use hand sanitizer.  Contact a doctor if:  You have a fever.  You lose sleep because your cough medicine does not help. Get help right away if:  You are short of breath and it gets worse.  You have more chest pain.  Your sickness gets worse. This is very serious if: ? You are an older adult. ? Your body's defense system is weak.  You cough up blood. This information is not intended to replace advice given to you by your health care provider. Make sure you discuss any questions you have with your health care provider. Document Released: 08/14/2007 Document Revised: 08/03/2015 Document Reviewed: 06/22/2014 Elsevier Interactive Patient Education  2018 Elsevier Inc.  

## 2017-09-30 NOTE — Telephone Encounter (Signed)
Called in c/o having a cold and feeling short of breath with exertion.   She started feeling bad on Saturday but became worse yesterday.   She was in the hospital with pneumonia in June.   Has asthma.    I scheduled her with Dr. Alvy BimlerSagardia for today at 11:40.   Reason for Disposition . [1] MILD difficulty breathing (e.g., minimal/no SOB at rest, SOB with walking, pulse <100) AND [2] NEW-onset or WORSE than normal    Was in hospital in June with Pneumonia.  Answer Assessment - Initial Assessment Questions 1. RESPIRATORY STATUS: "Describe your breathing?" (e.g., wheezing, shortness of breath, unable to speak, severe coughing)      When I walk to the bathroom I'm short of breath.  Sitting I'm fine.  Coughing up nothing.   I'm blowing yellow-green stuff from my nose. 2. ONSET: "When did this breathing problem begin?"      Started feeling bad Saturday but yesterday I really got worse. 3. PATTERN "Does the difficult breathing come and go, or has it been constant since it started?"      It feels like when I went to the hospital but not as bad.    4. SEVERITY: "How bad is your breathing?" (e.g., mild, moderate, severe)    - MILD: No SOB at rest, mild SOB with walking, speaks normally in sentences, can lay down, no retractions, pulse < 100.    - MODERATE: SOB at rest, SOB with minimal exertion and prefers to sit, cannot lie down flat, speaks in phrases, mild retractions, audible wheezing, pulse 100-120.    - SEVERE: Very SOB at rest, speaks in single words, struggling to breathe, sitting hunched forward, retractions, pulse > 120      It's hard to talk when I'm walking around due to the shortness of breath. 5. RECURRENT SYMPTOM: "Have you had difficulty breathing before?" If so, ask: "When was the last time?" and "What happened that time?"      Yes   Was in the hospital in June with Pneumonia. 6. CARDIAC HISTORY: "Do you have any history of heart disease?" (e.g., heart attack, angina, bypass surgery,  angioplasty)      No 7. LUNG HISTORY: "Do you have any history of lung disease?"  (e.g., pulmonary embolus, asthma, emphysema)     I have asthma.   Smoker.   Not on O2. 8. CAUSE: "What do you think is causing the breathing problem?"      I have a cold.    9. OTHER SYMPTOMS: "Do you have any other symptoms? (e.g., dizziness, runny nose, cough, chest pain, fever)     No sore throat.   Having chills and sweats.  Don't have a thermometer. 10. PREGNANCY: "Is there any chance you are pregnant?" "When was your last menstrual period?"       No.   LMP last week 11. TRAVEL: "Have you traveled out of the country in the last month?" (e.g., travel history, exposures)       No.   No travels.  Protocols used: BREATHING DIFFICULTY-A-AH

## 2017-10-03 ENCOUNTER — Encounter: Payer: Self-pay | Admitting: Emergency Medicine

## 2017-10-03 ENCOUNTER — Other Ambulatory Visit: Payer: Self-pay

## 2017-10-03 ENCOUNTER — Ambulatory Visit: Payer: BC Managed Care – PPO | Admitting: Emergency Medicine

## 2017-10-03 VITALS — BP 108/64 | HR 78 | Temp 99.1°F | Resp 16 | Wt 225.2 lb

## 2017-10-03 DIAGNOSIS — R05 Cough: Secondary | ICD-10-CM | POA: Diagnosis not present

## 2017-10-03 DIAGNOSIS — J22 Unspecified acute lower respiratory infection: Secondary | ICD-10-CM | POA: Diagnosis not present

## 2017-10-03 DIAGNOSIS — Z8709 Personal history of other diseases of the respiratory system: Secondary | ICD-10-CM | POA: Diagnosis not present

## 2017-10-03 DIAGNOSIS — R0981 Nasal congestion: Secondary | ICD-10-CM

## 2017-10-03 DIAGNOSIS — R059 Cough, unspecified: Secondary | ICD-10-CM

## 2017-10-03 MED ORDER — PSEUDOEPHEDRINE-GUAIFENESIN ER 60-600 MG PO TB12: 1.0000 | ORAL_TABLET | Freq: Two times a day (BID) | ORAL | 1 refills | Status: AC

## 2017-10-03 NOTE — Patient Instructions (Addendum)
     IF you received an x-ray today, you will receive an invoice from [facility]. Please contact [facility] at [phone] with questions or concerns regarding your invoice.   IF you received labwork today, you will receive an invoice from [facility]. Please contact [facility] at [phone] with questions or concerns regarding your invoice.   Our billing staff will not be able to assist you with questions regarding bills from these companies.  You will be contacted with the lab results as soon as they are available. The fastest way to get your results is to activate your My Chart account. Instructions are located on the last page of this paperwork. If you have not heard from us regarding the results in 2 weeks, please contact this office.     Cough, Adult A cough helps to clear your throat and lungs. A cough may last only 2-3 weeks (acute), or it may last longer than 8 weeks (chronic). Many different things can cause a cough. A cough may be a sign of an illness or another medical condition. Follow these instructions at home:  Pay attention to any changes in your cough.  Take medicines only as told by your doctor. ? If you were prescribed an antibiotic medicine, take it as told by your doctor. Do not stop taking it even if you start to feel better. ? Talk with your doctor before you try using a cough medicine.  Drink enough fluid to keep your pee (urine) clear or pale yellow.  If the air is dry, use a cold steam vaporizer or humidifier in your home.  Stay away from things that make you cough at work or at home.  If your cough is worse at night, try using extra pillows to raise your head up higher while you sleep.  Do not smoke, and try not to be around smoke. If you need help quitting, ask your doctor.  Do not have caffeine.  Do not drink alcohol.  Rest as needed. Contact a doctor if:  You have new problems (symptoms).  You cough up yellow fluid  (pus).  Your cough does not get better after 2-3 weeks, or your cough gets worse.  Medicine does not help your cough and you are not sleeping well.  You have pain that gets worse or pain that is not helped with medicine.  You have a fever.  You are losing weight and you do not know why.  You have night sweats. Get help right away if:  You cough up blood.  You have trouble breathing.  Your heartbeat is very fast. This information is not intended to replace advice given to you by your health care provider. Make sure you discuss any questions you have with your health care provider. Document Released: 11/08/2010 Document Revised: 08/03/2015 Document Reviewed: 05/04/2014 Elsevier Interactive Patient Education  2018 Elsevier Inc.  

## 2017-10-03 NOTE — Progress Notes (Signed)
[name] 32 y.o.   Chief Complaint  Patient presents with  . Pneumonia    FOLLOW UP per patient better    HISTORY OF PRESENT ILLNESS: This is a 32 y.o. female seen by me on 09/30/2017 for lower respiratory tract infection (pneumonia).  Started on Zithromax.  Also has a history of asthma and was having a lot of bronchospasm.  Taking prednisone and bronchodilator inhalers.  Also taking cough medicine.  Doing much better.  No new complaints.  Symptoms much improved.  Denies fever or chills.  Denies difficulty breathing body but she has occasional wheezing still.  Also still has some nasal congestion.  Taking Flonase.  Chronic smoker.  Status post pneumonia 08/18/2017 when she spent 3 days in the hospital.  Respiratory panel showed rhinovirus.  Was negative for strep pneumonia, mycoplasma, influenza, and a few other germs.  HPI   Prior to Admission medications   Medication Sig Start Date End Date Taking? Authorizing Provider  acetaminophen (TYLENOL) 325 MG tablet Take 650 mg by mouth every 6 (six) hours as needed for pain.   Yes [provider]  albuterol (PROVENTIL HFA;VENTOLIN HFA) 108 (90 Base) MCG/ACT inhaler Inhale 2 puffs into the lungs every 6 (six) hours as needed for wheezing or shortness of breath. 09/30/17  Yes Taline Nass, Eilleen Kempf, MD  azithromycin (ZITHROMAX) 250 MG tablet Sig as indicated 09/30/17  Yes Briante Loveall, Eilleen Kempf, MD  cetirizine (ZYRTEC) 10 MG chewable tablet Chew 10 mg by mouth daily.   Yes [provider]  Dulaglutide (TRULICITY) 1.5 MG/0.5ML SOPN Inject 1.5 mg into the skin once a week.    Yes [provider]  fluticasone (FLONASE) 50 MCG/ACT nasal spray Place 2 sprays into both nostrils at bedtime. 12/30/15  Yes Sherren Mocha, MD  guaiFENesin-dextromethorphan (ROBITUSSIN DM) 100-10 MG/5ML syrup Take 5 mLs by mouth every 4 (four) hours as needed for cough. 08/21/17  Yes Vann, Jessica U, DO  Menthol, Topical Analgesic, (BENGAY COLD THERAPY  EX) Apply 1 application topically as needed (muscle cramp).   Yes [provider]  Phentermine-Topiramate (QSYMIA) 15-92 MG CP24 Take 1 tablet by mouth daily.    Yes [provider]  predniSONE (DELTASONE) 20 MG tablet Take 2 tablets (40 mg total) by mouth daily with breakfast for 5 days. 09/30/17 10/05/17 Yes Dimas Scheck, Eilleen Kempf, MD  acidophilus [facility]) CAPS capsule Take 1 capsule by mouth daily. Patient not taking: Reported on 09/30/2017 08/22/17   Joseph Art, DO  ondansetron (ZOFRAN) 4 MG tablet Take 1 tablet (4 mg total) by mouth every 8 (eight) hours as needed for nausea or vomiting. Patient not taking: Reported on 09/30/2017 08/21/17   Joseph Art, DO  predniSONE (DELTASONE) 20 MG tablet Take 2 tablets (40 mg total) by mouth daily with breakfast. For 3 days and then stop Patient not taking: Reported on 10/03/2017 08/22/17   Joseph Art, DO    No Known Allergies  Patient Active Problem List   Diagnosis Date Noted  . Lower resp. tract infection 09/30/2017  . Cough 09/30/2017  . Shortness of breath 09/30/2017  . History of asthma 09/30/2017  . Diffuse wheezing 09/30/2017  . Acute on chronic respiratory failure with hypoxia (HCC) 08/19/2017  . Asthma exacerbation 08/19/2017  . Lobar pneumonia (HCC) 08/19/2017  . Sepsis (HCC) 08/19/2017  . Tobacco use disorder 04/13/2017  . Obesity (BMI 30-39.9) 09/13/2014    Past Medical History:  Diagnosis Date  . Allergy   . Asthma   .  Seasonal allergies     Past Surgical History:  Procedure Laterality Date  . DENTAL SURGERY      Social History   Socioeconomic History  . Marital status: Single    Spouse name: Not on file  . Number of children: Not on file  . Years of education: Not on file  . Highest education level: Not on file  Occupational History  . Not on file  Social Needs  . Financial resource strain: Not on file  . Food insecurity:    Worry: Not on file    Inability: Not on file  .  Transportation needs:    Medical: Not on file    Non-medical: Not on file  Tobacco Use  . Smoking status: Current Every Day Smoker    Packs/day: 0.50    Years: 4.00    Pack years: 2.00    Types: Cigarettes  . Smokeless tobacco: Never Used  Substance and Sexual Activity  . Alcohol use: Yes    Alcohol/week: 2.4 oz    Types: 2 Glasses of wine, 2 Standard drinks or equivalent per week  . Drug use: No  . Sexual activity: Yes    Birth control/protection: Implant  Lifestyle  . Physical activity:    Days per week: Not on file    Minutes per session: Not on file  . Stress: Not on file  Relationships  . Social connections:    Talks on phone: Not on file    Gets together: Not on file    Attends religious service: Not on file    Active member of club or organization: Not on file    Attends meetings of clubs or organizations: Not on file    Relationship status: Not on file  . Intimate partner violence:    Fear of current or ex partner: Not on file    Emotionally abused: Not on file    Physically abused: Not on file    Forced sexual activity: Not on file  Other Topics Concern  . Not on file  Social History Narrative  . Not on file    Family History  Problem Relation Age of Onset  . Cancer Father        prostate   . Liver disease Father   . Liver disease Maternal Grandfather      Review of Systems  Constitutional: Negative.  Negative for chills and fever.  HENT: Positive for congestion. Negative for nosebleeds and sore throat.   Eyes: Negative.  Negative for blurred vision and double vision.  Respiratory: Positive for cough and wheezing.   Cardiovascular: Negative.  Negative for chest pain, palpitations and leg swelling.  Gastrointestinal: Negative.  Negative for abdominal pain, blood in stool, diarrhea, melena, nausea and vomiting.  Genitourinary: Negative.  Negative for dysuria and hematuria.  Musculoskeletal: Negative.  Negative for back pain, myalgias and neck pain.    Skin: Negative.  Negative for rash.  Neurological: Negative.  Negative for dizziness and headaches.  Endo/Heme/Allergies: Negative.   All other systems reviewed and are negative.   Vitals:   10/03/17 1438  BP: 108/64  Pulse: 78  Resp: 16  Temp: 99.1 F (37.3 C)  SpO2: 93%    Physical Exam  Constitutional: She is oriented to person, place, and time. She appears well-developed and well-nourished.  HENT:  Head: Normocephalic and atraumatic.  Nose: Nose normal.  Mouth/Throat: Oropharynx is clear and moist.  Eyes: Pupils are equal, round, and reactive to light. Conjunctivae are normal.  Neck: Normal range of motion. Neck supple.  Cardiovascular: Normal rate, regular rhythm and normal heart sounds.  Pulmonary/Chest: Effort normal and breath sounds normal.  Abdominal: Soft. Bowel sounds are normal. She exhibits no distension. There is no tenderness.  Musculoskeletal: Normal range of motion.  Neurological: She is alert and oriented to person, place, and time. No sensory deficit. She exhibits normal muscle tone.  Skin: Skin is warm and dry. Capillary refill takes less than 2 seconds.  Psychiatric: She has a normal mood and affect. Her behavior is normal.  Vitals reviewed.   A total of 25 minutes was spent in the room with the patient, greater than 50% of which was in counseling/coordination of care regarding differential diagnosis, management medications, and need for follow-up.  Blood results and chest x-ray reviewed with patient in the room..  ASSESSMENT & PLAN: [name] was seen today for pneumonia.  Diagnoses and all orders for this visit:  Lower resp. tract infection -     pseudoephedrine-guaifenesin (MUCINEX D) 60-600 MG 12 hr tablet; Take 1 tablet by mouth every 12 (twelve) hours for 5 days.  Cough  Nasal congestion  History of asthma    Patient Instructions       IF you received an x-ray today, you will receive an invoice from [facility]. Please  contact [facility] at [phone] with questions or concerns regarding your invoice.   IF you received labwork today, you will receive an invoice from [facility]. Please contact [facility] at [phone] with questions or concerns regarding your invoice.   Our billing staff will not be able to assist you with questions regarding bills from these companies.  You will be contacted with the lab results as soon as they are available. The fastest way to get your results is to activate your My Chart account. Instructions are located on the last page of this paperwork. If you have not heard from us regarding the results in 2 weeks, please contact this office.     Cough, Adult A cough helps to clear your throat and lungs. A cough may last only 2-3 weeks (acute), or it may last longer than 8 weeks (chronic). Many different things can cause a cough. A cough may be a sign of an illness or another medical condition. Follow these instructions at home:  Pay attention to any changes in your cough.  Take medicines only as told by your doctor. ? If you were prescribed an antibiotic medicine, take it as told by your doctor. Do not stop taking it even if you start to feel better. ? Talk with your doctor before you try using a cough medicine.  Drink enough fluid to keep your pee (urine) clear or pale yellow.  If the air is dry, use a cold steam vaporizer or humidifier in your home.  Stay away from things that make you cough at work or at home.  If your cough is worse at night, try using extra pillows to raise your head up higher while you sleep.  Do not smoke, and try not to be around smoke. If you need help quitting, ask your doctor.  Do not have caffeine.  Do not drink alcohol.  Rest as needed. Contact a doctor if:  You have new problems (symptoms).  You cough up yellow fluid (pus).  Your cough does not get better after 2-3 weeks, or your cough gets worse.  Medicine does not help  your cough and you are not sleeping well.  You have pain  that gets worse or pain that is not helped with medicine.  You have a fever.  You are losing weight and you do not know why.  You have night sweats. Get help right away if:  You cough up blood.  You have trouble breathing.  Your heartbeat is very fast. This information is not intended to replace advice given to you by your health care provider. Make sure you discuss any questions you have with your health care provider. Document Released: 11/08/2010 Document Revised: 08/03/2015 Document Reviewed: 05/04/2014 Elsevier Interactive Patient Education  2018 Elsevier Inc.      Edwina Barth, MD [facility]

## 2017-10-04 ENCOUNTER — Telehealth: Payer: Self-pay | Admitting: Emergency Medicine

## 2017-10-04 NOTE — Telephone Encounter (Signed)
Pt. Called to inform the practice that the pharmacy that Dr. Alvy BimlerSagardia had sent a prescription for Mucinex D was refusing to fill the script as written and was asserting that the pt. Buy a larger quantity than prescribed.

## 2017-10-08 NOTE — Telephone Encounter (Signed)
Pharmacy was called and they advised the patient could not pick up because it was on back order.  However, it should be ready for pickup in a few days.

## 2018-01-08 ENCOUNTER — Ambulatory Visit (INDEPENDENT_AMBULATORY_CARE_PROVIDER_SITE_OTHER): Payer: BC Managed Care – PPO | Admitting: Physician Assistant

## 2018-01-08 DIAGNOSIS — Z23 Encounter for immunization: Secondary | ICD-10-CM

## 2018-01-08 NOTE — Progress Notes (Signed)
I did not personally see this patient

## 2018-03-09 ENCOUNTER — Encounter: Payer: Self-pay | Admitting: Family Medicine

## 2018-03-09 ENCOUNTER — Other Ambulatory Visit: Payer: Self-pay

## 2018-03-09 ENCOUNTER — Ambulatory Visit: Payer: BC Managed Care – PPO | Admitting: Family Medicine

## 2018-03-09 VITALS — HR 109 | Temp 100.3°F | Ht 62.25 in | Wt 227.4 lb

## 2018-03-09 DIAGNOSIS — R6889 Other general symptoms and signs: Secondary | ICD-10-CM | POA: Diagnosis not present

## 2018-03-09 DIAGNOSIS — J101 Influenza due to other identified influenza virus with other respiratory manifestations: Secondary | ICD-10-CM

## 2018-03-09 DIAGNOSIS — R3989 Other symptoms and signs involving the genitourinary system: Secondary | ICD-10-CM

## 2018-03-09 LAB — POCT URINALYSIS DIP (MANUAL ENTRY)
Bilirubin, UA: NEGATIVE
Glucose, UA: NEGATIVE mg/dL
Ketones, POC UA: NEGATIVE mg/dL
Leukocytes, UA: NEGATIVE
Nitrite, UA: NEGATIVE
PH UA: 5.5 (ref 5.0–8.0)
Protein Ur, POC: 30 mg/dL — AB
SPEC GRAV UA: 1.025 (ref 1.010–1.025)
UROBILINOGEN UA: 0.2 U/dL

## 2018-03-09 LAB — POC INFLUENZA A&B (BINAX/QUICKVUE)
INFLUENZA B, POC: POSITIVE — AB
Influenza A, POC: NEGATIVE

## 2018-03-09 MED ORDER — OSELTAMIVIR PHOSPHATE 75 MG PO CAPS
75.0000 mg | ORAL_CAPSULE | Freq: Two times a day (BID) | ORAL | 0 refills | Status: DC
Start: 2018-03-09 — End: 2018-12-24

## 2018-03-09 NOTE — Patient Instructions (Addendum)
  Drink plenty of fluids and get enough rest  Take over-the-counter cough medication such as Robitussin-DM or Mucinex DM or Delsym to help suppress the cough.  Take tamiflu one twice daily for infection.  Use the benzonatate cough pills 1 or 2 pills 3 times daily.  These are particularly helpful when you go back to work because they will not cause a lot of drowsiness.  They are not as strongest cough suppressant as the cough syrups.  Tylenol or ibuprofen if needed for fever or aching  Stop smoking.  As I have talked to you about, it is best if you wake up you mind to quit, set a target date, taper down, and on that day quit completely and never touched another one.  Make no excuses for even smoking 1 cigarette ever again.  When better, get a flu shot.  Try to drink less in the evening, but drink plenty through the day to flush your bladder through well.  If symptoms persist you might need to be placed on some other medications for an overactive bladder.  Reassess if needed.   If you have lab work done today you will be contacted with your lab results within the next 2 weeks.  If you have not heard from us then please contact us. The fastest way to get your results is to register for My Chart.   IF you received an x-ray today, you will receive an invoice from [facility]. Please contact [facility] at [phone] with questions or concerns regarding your invoice.   IF you received labwork today, you will receive an invoice from [facility]. Please contact [facility] at [phone] with questions or concerns regarding your invoice.   Our billing staff will not be able to assist you with questions regarding bills from these companies.  You will be contacted with the lab results as soon as they are available. The fastest way to get your results is to activate your My Chart account. Instructions are located on the last page of this paperwork. If you have not heard from us  regarding the results in 2 weeks, please contact this office.

## 2018-03-09 NOTE — Progress Notes (Signed)
Patient ID: [name], female    DOB: 03/29/1985  Age: 32 y.o. MRN: [id number]  Chief Complaint  Patient presents with  . Fever    flu like symptoms. taking otc, helps until she lyes down for night time    Subjective:   32 year old lady who presents with a history of having been sick since Friday night.  She developed cough.  She has some body aches when she is lying down.  She normally smokes about a third of pack a day of cigarettes.  She did not get a flu shot this year, because several years ago when she had had a flu shot is when she got the flu.  She has a history of pneumonia as an influenza in the past, and does not feel like she did with either of those illnesses.  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  She does not have any ear pain.  No sinus congestion.  No sore throat throat.  Is not coughing up anything, this is a dry cough.  Felt a little feverish but did not take a temp over the weekend.  Has a low-grade temp this morning.  Also has had some problems with her urine leaking. Current allergies, medications, problem list, past/family and social histories reviewed.  Objective:  Pulse (!) 109   Temp 100.3 F (37.9 C) (Oral)   Ht 5' 2.25" (1.581 m)   Wt 227 lb 6.4 oz (103.1 kg)   LMP 03/08/2018   SpO2 96%   BMI 41.26 kg/m   No major acute distress.  TMs normal.  Throat clear.  Sinuses nontender.  Neck supple without nodes.  Chest is clear to auscultation.  No wheezes, rales, or rhonchi.  Heart regular without murmur.  Urinalysis normal.  Assessment & Plan:   Assessment: 1. Flu-like symptoms       Plan: Check the flu test.  This is most likely a non-influenza flulike virus.  Orders Placed This Encounter  Procedures  . POC Influenza A&B(BINAX/QUICKVUE)   Does have influenza B test No orders of the defined types were placed in this encounter.        Patient Instructions        If you have lab work done today you will be contacted with your lab results within the next 2 weeks.  If you have not heard from us then please contact us. The fastest way to get your results is to register for My Chart.   IF you received an x-ray today, you will receive an invoice from [facility]. Please contact [facility] at [phone] with questions or concerns regarding your invoice.   IF you received labwork today, you will receive an invoice from [facility]. Please contact [facility] at [phone] with questions or concerns regarding your invoice.   Our billing staff will not be able to assist you with questions regarding bills from these companies.  You will be contacted with the lab results as soon as they are available. The fastest way to get your results is to activate your My Chart account. Instructions are located on the last page of this paperwork. If you have not heard from us regarding the results in 2 weeks, please contact this office.        No follow-ups on file.   Janace Hoardavid Artice Holohan, MD 03/09/2018

## 2018-12-24 ENCOUNTER — Other Ambulatory Visit: Payer: Self-pay

## 2018-12-24 ENCOUNTER — Telehealth (INDEPENDENT_AMBULATORY_CARE_PROVIDER_SITE_OTHER): Payer: BC Managed Care – PPO | Admitting: Emergency Medicine

## 2018-12-24 ENCOUNTER — Encounter: Payer: Self-pay | Admitting: Emergency Medicine

## 2018-12-24 VITALS — Ht 62.0 in | Wt 220.0 lb

## 2018-12-24 DIAGNOSIS — J029 Acute pharyngitis, unspecified: Secondary | ICD-10-CM

## 2018-12-24 DIAGNOSIS — J069 Acute upper respiratory infection, unspecified: Secondary | ICD-10-CM | POA: Diagnosis not present

## 2018-12-24 MED ORDER — FLUCONAZOLE 150 MG PO TABS: 150.0000 mg | ORAL_TABLET | Freq: Once | ORAL | 0 refills | Status: AC

## 2018-12-24 MED ORDER — AZITHROMYCIN 250 MG PO TABS: ORAL_TABLET | ORAL | 0 refills | Status: DC

## 2018-12-24 NOTE — Patient Instructions (Signed)

## 2018-12-24 NOTE — Addendum Note (Signed)
Addended by: Davina Poke on: 12/24/2018 03:19 PM   Modules accepted: Orders

## 2018-12-24 NOTE — Progress Notes (Signed)
[name] 33 y.o.   No chief complaint on file.   HISTORY OF PRESENT ILLNESS: This is a 33 y.o. female complaining of itchy throat along with ear pain that started 3 days ago progressively getting worse.  Denies cough.  Denies fever or chills.  Denies shortness of breath or wheezing.  Denies chest pain.  Able to eat and drink okay.  Denies difficulty swallowing.  Denies abdominal pain or diarrhea.  No other significant symptoms.  HPI   Prior to Admission medications   Medication Sig Start Date End Date Taking? Authorizing Provider  acetaminophen (TYLENOL) 325 MG tablet Take 650 mg by mouth every 6 (six) hours as needed for pain.   Yes [provider]  albuterol (PROVENTIL HFA;VENTOLIN HFA) 108 (90 Base) MCG/ACT inhaler Inhale 2 puffs into the lungs every 6 (six) hours as needed for wheezing or shortness of breath. 09/30/17  Yes Angeles Zehner, Eilleen Kempf, MD  cetirizine (ZYRTEC) 10 MG chewable tablet Chew 10 mg by mouth daily.   Yes [provider]  fluticasone (FLONASE) 50 MCG/ACT nasal spray Place 2 sprays into both nostrils at bedtime. 12/30/15  Yes Sherren Mocha, MD  Menthol, Topical Analgesic, (BENGAY COLD THERAPY EX) Apply 1 application topically as needed (muscle cramp).   Yes [provider]  acidophilus (RISAQUAD) CAPS capsule Take 1 capsule by mouth daily. Patient not taking: Reported on 12/24/2018 08/22/17   Joseph Art, DO  ondansetron (ZOFRAN) 4 MG tablet Take 1 tablet (4 mg total) by mouth every 8 (eight) hours as needed for nausea or vomiting. Patient not taking: Reported on 12/24/2018 08/21/17   Joseph Art, DO    No Known Allergies  Patient Active Problem List   Diagnosis Date Noted  . History of asthma 09/30/2017  . Tobacco use disorder 04/13/2017  . Obesity (BMI 30-39.9) 09/13/2014    Past Medical History:  Diagnosis Date  . Allergy   . Asthma   . Seasonal allergies     Past Surgical History:  Procedure Laterality Date  .  DENTAL SURGERY      Social History   Socioeconomic History  . Marital status: Single    Spouse name: Not on file  . Number of children: Not on file  . Years of education: Not on file  . Highest education level: Not on file  Occupational History  . Not on file  Social Needs  . Financial resource strain: Not on file  . Food insecurity    Worry: Not on file    Inability: Not on file  . Transportation needs    Medical: Not on file    Non-medical: Not on file  Tobacco Use  . Smoking status: Current Every Day Smoker    Packs/day: 0.50    Years: 4.00    Pack years: 2.00    Types: Cigarettes  . Smokeless tobacco: Never Used  Substance and Sexual Activity  . Alcohol use: Yes    Alcohol/week: 4.0 standard drinks    Types: 2 Glasses of wine, 2 Standard drinks or equivalent per week  . Drug use: No  . Sexual activity: Yes    Birth control/protection: Implant  Lifestyle  . Physical activity    Days per week: Not on file    Minutes per session: Not on file  . Stress: Not on file  Relationships  . Social Musician on phone: Not on file    Gets together: Not on file  Attends religious service: Not on file    Active member of club or organization: Not on file    Attends meetings of clubs or organizations: Not on file    Relationship status: Not on file  . Intimate partner violence    Fear of current or ex partner: Not on file    Emotionally abused: Not on file    Physically abused: Not on file    Forced sexual activity: Not on file  Other Topics Concern  . Not on file  Social History Narrative  . Not on file    Family History  Problem Relation Age of Onset  . Cancer Father        prostate   . Liver disease Father   . Liver disease Maternal Grandfather      Review of Systems  Constitutional: Negative for fever.  HENT: Positive for ear pain and sore throat.   Eyes: Negative.   Respiratory: Negative.  Negative for cough and shortness of breath.    Cardiovascular: Negative.  Negative for chest pain and palpitations.  Gastrointestinal: Negative.  Negative for abdominal pain, diarrhea, nausea and vomiting.  Genitourinary: Negative.  Negative for dysuria.  Skin: Negative for rash.  Neurological: Negative.  Negative for dizziness and headaches.  Endo/Heme/Allergies: Negative.   All other systems reviewed and are negative.    Physical Exam Vitals signs reviewed.  HENT:     Head: Normocephalic and atraumatic.     Right Ear: Tympanic membrane, ear canal and external ear normal.     Left Ear: Tympanic membrane, ear canal and external ear normal.     Nose: Nose normal.     Mouth/Throat:     Mouth: Mucous membranes are moist.     Pharynx: Oropharynx is clear. Posterior oropharyngeal erythema present. No oropharyngeal exudate.  Eyes:     Extraocular Movements: Extraocular movements intact.     Conjunctiva/sclera: Conjunctivae normal.     Pupils: Pupils are equal, round, and reactive to light.  Neck:     Musculoskeletal: Normal range of motion and neck supple.  Cardiovascular:     Rate and Rhythm: Normal rate and regular rhythm.     Pulses: Normal pulses.     Heart sounds: Normal heart sounds.  Pulmonary:     Effort: Pulmonary effort is normal.     Breath sounds: Normal breath sounds.  Musculoskeletal: Normal range of motion.  Lymphadenopathy:     Cervical: No cervical adenopathy.  Skin:    General: Skin is warm and dry.  Neurological:     General: No focal deficit present.     Mental Status: She is alert and oriented to person, place, and time.  Psychiatric:        Mood and Affect: Mood normal.        Behavior: Behavior normal.      ASSESSMENT & PLAN: Diagnoses and all orders for this visit:  Acute upper respiratory infection -     azithromycin (ZITHROMAX) 250 MG tablet; Sig as indicated  Sore throat     Patient Instructions  Sore Throat When you have a sore throat, your throat may feel:  Tender.  Burning.   Irritated.  Scratchy.  Painful when you swallow.  Painful when you talk. Many things can cause a sore throat, such as:  An infection.  Allergies.  Dry air.  Smoke or pollution.  Radiation treatment.  Gastroesophageal reflux disease (GERD).  A tumor. A sore throat can be the first sign of another  sickness. It can happen with other problems, like:  Coughing.  Sneezing.  Fever.  Swelling in the neck. Most sore throats go away without treatment. Follow these instructions at home:      Take over-the-counter medicines only as told by your doctor. ? If your child has a sore throat, do not give your child aspirin.  Drink enough fluids to keep your pee (urine) pale yellow.  Rest when you feel you need to.  To help with pain: ? Sip warm liquids, such as broth, herbal tea, or warm water. ? Eat or drink cold or frozen liquids, such as frozen ice pops. ? Gargle with a salt-water mixture 3-4 times a day or as needed. To make a salt-water mixture, add -1 tsp (3-6 g) of salt to 1 cup (237 mL) of warm water. Mix it until you cannot see the salt anymore. ? Suck on hard candy or throat lozenges. ? Put a cool-mist humidifier in your bedroom at night. ? Sit in the bathroom with the door closed for 5-10 minutes while you run hot water in the shower.  Do not use any products that contain nicotine or tobacco, such as cigarettes, e-cigarettes, and chewing tobacco. If you need help quitting, ask your doctor.  Wash your hands well and often with soap and water. If soap and water are not available, use hand sanitizer. Contact a doctor if:  You have a fever for more than 2-3 days.  You keep having symptoms for more than 2-3 days.  Your throat does not get better in 7 days.  You have a fever and your symptoms suddenly get worse.  Your child who is 3 months to 67 years old has a temperature of 102.70F (39C) or higher. Get help right away if:  You have trouble breathing.   You cannot swallow fluids, soft foods, or your saliva.  You have swelling in your throat or neck that gets worse.  You keep feeling sick to your stomach (nauseous).  You keep throwing up (vomiting). Summary  A sore throat is pain, burning, irritation, or scratchiness in the throat. Many things can cause a sore throat.  Take over-the-counter medicines only as told by your doctor. Do not give your child aspirin.  Drink plenty of fluids, and rest as needed.  Contact a doctor if your symptoms get worse or your sore throat does not get better within 7 days. This information is not intended to replace advice given to you by your health care provider. Make sure you discuss any questions you have with your health care provider. Document Released: 12/05/2007 Document Revised: 07/28/2017 Document Reviewed: 07/28/2017 Elsevier Patient Education  2020 [employer]     Agustina Caroli, MD [facility]

## 2018-12-24 NOTE — Progress Notes (Signed)
Called patient to triage for virtual appointment. I spoke to Dr Mitchel Honour about the patient's symptoms and he saw the patient in the office. Patient is complaining of left ear pain at night for 3 days. Patient states the day before ear pain the throat was itchy.

## 2019-01-07 ENCOUNTER — Ambulatory Visit: Payer: BC Managed Care – PPO | Admitting: Emergency Medicine

## 2019-01-07 ENCOUNTER — Encounter: Payer: Self-pay | Admitting: Emergency Medicine

## 2019-01-07 ENCOUNTER — Other Ambulatory Visit: Payer: Self-pay

## 2019-01-07 VITALS — BP 90/66 | HR 94 | Temp 98.6°F | Ht 62.0 in | Wt 231.2 lb

## 2019-01-07 DIAGNOSIS — R079 Chest pain, unspecified: Secondary | ICD-10-CM

## 2019-01-07 NOTE — Patient Instructions (Addendum)
   If you have lab work done today you will be contacted with your lab results within the next 2 weeks.  If you have not heard from us then please contact us. The fastest way to get your results is to register for My Chart.   IF you received an x-ray today, you will receive an invoice from [facility]. Please contact [facility] at [phone] with questions or concerns regarding your invoice.   IF you received labwork today, you will receive an invoice from [facility]. Please contact [facility] at [phone] with questions or concerns regarding your invoice.   Our billing staff will not be able to assist you with questions regarding bills from these companies.  You will be contacted with the lab results as soon as they are available. The fastest way to get your results is to activate your My Chart account. Instructions are located on the last page of this paperwork. If you have not heard from us regarding the results in 2 weeks, please contact this office.     Nonspecific Chest Pain Chest pain can be caused by many different conditions. Some causes of chest pain can be life-threatening. These will require treatment right away. Serious causes of chest pain include:  Heart attack.  A tear in the body's main blood vessel.  Redness and swelling (inflammation) around your heart.  Blood clot in your lungs. Other causes of chest pain may not be so serious. These include:  Heartburn.  Anxiety or stress.  Damage to bones or muscles in your chest.  Lung infections. Chest pain can feel like:  Pain or discomfort in your chest.  Crushing, pressure, aching, or squeezing pain.  Burning or tingling.  Dull or sharp pain that is worse when you move, cough, or take a deep breath.  Pain or discomfort that is also felt in your back, neck, jaw, shoulder, or arm, or pain that spreads to any of these areas. It is hard to know whether your pain is caused by something that is  serious or something that is not so serious. So it is important to see your doctor right away if you have chest pain. Follow these instructions at home: Medicines  Take over-the-counter and prescription medicines only as told by your doctor.  If you were prescribed an antibiotic medicine, take it as told by your doctor. Do not stop taking the antibiotic even if you start to feel better. Lifestyle   Rest as told by your doctor.  Do not use any products that contain nicotine or tobacco, such as cigarettes, e-cigarettes, and chewing tobacco. If you need help quitting, ask your doctor.  Do not drink alcohol.  Make lifestyle changes as told by your doctor. These may include: ? Getting regular exercise. Ask your doctor what activities are safe for you. ? Eating a heart-healthy diet. A diet and nutrition specialist (dietitian) can help you to learn healthy eating options. ? Staying at a healthy weight. ? Treating diabetes or high blood pressure, if needed. ? Lowering your stress. Activities such as yoga and relaxation techniques can help. General instructions  Pay attention to any changes in your symptoms. Tell your doctor about them or any new symptoms.  Avoid any activities that cause chest pain.  Keep all follow-up visits as told by your doctor. This is important. You may need more testing if your chest pain does not go away. Contact a doctor if:  Your chest pain does not go away.  You feel   depressed.  You have a fever. Get help right away if:  Your chest pain is worse.  You have a cough that gets worse, or you cough up blood.  You have very bad (severe) pain in your belly (abdomen).  You pass out (faint).  You have either of these for no clear reason: ? Sudden chest discomfort. ? Sudden discomfort in your arms, back, neck, or jaw.  You have shortness of breath at any time.  You suddenly start to sweat, or your skin gets clammy.  You feel sick to your stomach  (nauseous).  You throw up (vomit).  You suddenly feel lightheaded or dizzy.  You feel very weak or tired.  Your heart starts to beat fast, or it feels like it is skipping beats. These symptoms may be an emergency. Do not wait to see if the symptoms will go away. Get medical help right away. Call your local emergency services (911 in the [place] Do not drive yourself to the hospital. Summary  Chest pain can be caused by many different conditions. The cause may be serious and need treatment right away. If you have chest pain, see your doctor right away.  Follow your doctor's instructions for taking medicines and making lifestyle changes.  Keep all follow-up visits as told by your doctor. This includes visits for any further testing if your chest pain does not go away.  Be sure to know the signs that show that your condition has become worse. Get help right away if you have these symptoms. This information is not intended to replace advice given to you by your health care provider. Make sure you discuss any questions you have with your health care provider. Document Released: 08/14/2007 Document Revised: 08/28/2017 Document Reviewed: 08/28/2017 Elsevier Patient Education  2020 [employer]  

## 2019-01-07 NOTE — Progress Notes (Signed)
[name] 33 y.o.   Chief Complaint  Patient presents with  . Chest Pain    on the LEFT side of chest x 2day   . Foot Pain    right x 2 days radiates to chest happened on night    HISTORY OF PRESENT ILLNESS: This is a 33 y.o. female with history of asthma, occasional smoker, developed sharp pain to her right foot 2 nights ago that radiated up into her left chest and lasted approximately 5 to 10 seconds.  No associated symptoms at the time.  The following day she noticed sharp pain to her left chest that has been persistently there, worse with movement, without associated symptoms.  No history of diabetes or hypertension.  No history of dyslipidemia.  No other significant symptoms.  HPI   Prior to Admission medications   Medication Sig Start Date End Date Taking? Authorizing Provider  acetaminophen (TYLENOL) 325 MG tablet Take 650 mg by mouth every 6 (six) hours as needed for pain.   Yes [provider]  albuterol (PROVENTIL HFA;VENTOLIN HFA) 108 (90 Base) MCG/ACT inhaler Inhale 2 puffs into the lungs every 6 (six) hours as needed for wheezing or shortness of breath. 09/30/17  Yes [name], Eilleen KempfMiguel Jose, MD  cetirizine (ZYRTEC) 10 MG chewable tablet Chew 10 mg by mouth daily.   Yes [provider]  fluticasone (FLONASE) 50 MCG/ACT nasal spray Place 2 sprays into both nostrils at bedtime. 12/30/15  Yes Taylor MochaShaw, Taylor N, MD  Menthol, Topical Analgesic, (BENGAY COLD THERAPY EX) Apply 1 application topically as needed (muscle cramp).   Yes [provider]  acidophilus (RISAQUAD) CAPS capsule Take 1 capsule by mouth daily. Patient not taking: Reported on 01/07/2019 08/22/17   [name], [name], [name]  ondansetron (ZOFRAN) 4 MG tablet Take 1 tablet (4 mg total) by mouth every 8 (eight) hours as needed for nausea or vomiting. Patient not taking: Reported on 12/24/2018 08/21/17   [name], [name], [name]    No Known Allergies  Patient Active Problem List   Diagnosis Date Noted   . History of asthma 09/30/2017  . Tobacco use disorder 04/13/2017  . Obesity (BMI 30-39.9) 09/13/2014    Past Medical History:  Diagnosis Date  . Allergy   . Asthma   . Seasonal allergies     Past Surgical History:  Procedure Laterality Date  . DENTAL SURGERY      Social History   Socioeconomic History  . Marital status: Single    Spouse name: Not on file  . Number of children: Not on file  . Years of education: Not on file  . Highest education level: Not on file  Occupational History  . Not on file  Social Needs  . Financial resource strain: Not on file  . Food insecurity    Worry: Not on file    Inability: Not on file  . Transportation needs    Medical: Not on file    Non-medical: Not on file  Tobacco Use  . Smoking status: Current Every Day Smoker    Packs/day: 0.50    Years: 4.00    Pack years: 2.00    Types: Cigarettes  . Smokeless tobacco: Never Used  Substance and Sexual Activity  . Alcohol use: Yes    Alcohol/week: 4.0 standard drinks    Types: 2 Glasses of wine, 2 Standard drinks or equivalent per week  . Drug use: No  . Sexual activity: Yes    Birth control/protection: Implant  Lifestyle  . Physical activity    Days per week: Not on file    Minutes per session: Not on file  . Stress: Not on file  Relationships  . Social Herbalist on phone: Not on file    Gets together: Not on file    Attends religious service: Not on file    Active member of club or organization: Not on file    Attends meetings of clubs or organizations: Not on file    Relationship status: Not on file  . Intimate partner violence    Fear of current or ex partner: Not on file    Emotionally abused: Not on file    Physically abused: Not on file    Forced sexual activity: Not on file  Other Topics Concern  . Not on file  Social History Narrative  . Not on file    Family History  Problem Relation Age of Onset  . Cancer Father        prostate   . Liver  disease Father   . Liver disease Maternal Grandfather      Review of Systems  Constitutional: Negative.  Negative for chills and fever.  HENT: Negative.  Negative for congestion and sore throat.   Respiratory: Negative.  Negative for cough and shortness of breath.   Cardiovascular: Positive for chest pain. Negative for palpitations and leg swelling.  Gastrointestinal: Negative.  Negative for abdominal pain, diarrhea, nausea and vomiting.  Genitourinary: Negative for flank pain and hematuria.  Skin: Negative.  Negative for rash.  Neurological: Negative.  Negative for dizziness and headaches.  Endo/Heme/Allergies: Negative.   All other systems reviewed and are negative.   Today's Vitals   01/07/19 1409  BP: 90/66  Pulse: 94  Temp: 98.6 F (37 C)  TempSrc: Oral  SpO2: 99%  Weight: 231 lb 3.2 oz (104.9 kg)  Height: 5\' 2"  (1.575 m)   Body mass index is 42.29 kg/m.   Physical Exam Vitals signs reviewed.  Constitutional:      Appearance: She is well-developed. She is obese.  HENT:     Head: Normocephalic.  Eyes:     Extraocular Movements: Extraocular movements intact.     Pupils: Pupils are equal, round, and reactive to light.  Neck:     Musculoskeletal: Normal range of motion and neck supple.  Cardiovascular:     Rate and Rhythm: Normal rate and regular rhythm.     Pulses: Normal pulses.     Heart sounds: Normal heart sounds.  Pulmonary:     Effort: Pulmonary effort is normal.     Breath sounds: Normal breath sounds.  Abdominal:     Palpations: Abdomen is soft.     Tenderness: There is no abdominal tenderness.  Musculoskeletal: Normal range of motion.  Skin:    General: Skin is warm and dry.     Capillary Refill: Capillary refill takes less than 2 seconds.  Neurological:     General: No focal deficit present.     Mental Status: She is alert and oriented to person, place, and time.  Psychiatric:        Mood and Affect: Mood normal.        Behavior: Behavior  normal.    EKG: Normal sinus rhythm with ventricular rate of 88.  No acute ischemic changes.  Normal EKG.  No axis deviation.  No pulmonary strain pattern.  ASSESSMENT & PLAN: [name] was seen today for chest pain and foot pain.  Diagnoses and all orders for this visit:  Nonspecific chest pain -     EKG 12-Lead   Clinically stable.  No red flag signs or symptoms.  No medical concerns identified during this visit. Pain most likely musculoskeletal in nature.  Advised to take Aleve once or twice a day for the next 5 days and contact the office if clinical picture changes or symptoms get worse.   Patient Instructions       If you have lab work done today you will be contacted with your lab results within the next 2 weeks.  If you have not heard from [place] then please contact us. The fastest way to get your results is to register for My Chart.   IF you received an x-ray today, you will receive an invoice from [facility]. Please contact [facility] at [phone] with questions or concerns regarding your invoice.   IF you received labwork today, you will receive an invoice from [facility]. Please contact [facility] at [phone] with questions or concerns regarding your invoice.   Our billing staff will not be able to assist you with questions regarding bills from these companies.  You will be contacted with the lab results as soon as they are available. The fastest way to get your results is to activate your My Chart account. Instructions are located on the last page of this paperwork. If you have not heard from [place] regarding the results in 2 weeks, please contact this office.     Nonspecific Chest Pain Chest pain can be caused by many different conditions. Some causes of chest pain can be life-threatening. These will require treatment right away. Serious causes of chest pain include:  Heart attack.  A tear in the body's main blood vessel.  Redness and swelling  (inflammation) around your heart.  Blood clot in your lungs. Other causes of chest pain may not be so serious. These include:  Heartburn.  Anxiety or stress.  Damage to bones or muscles in your chest.  Lung infections. Chest pain can feel like:  Pain or discomfort in your chest.  Crushing, pressure, aching, or squeezing pain.  Burning or tingling.  Dull or sharp pain that is worse when you move, cough, or take a deep breath.  Pain or discomfort that is also felt in your back, neck, jaw, shoulder, or arm, or pain that spreads to any of these areas. It is hard to know whether your pain is caused by something that is serious or something that is not so serious. So it is important to see your doctor right away if you have chest pain. Follow these instructions at home: Medicines  Take over-the-counter and prescription medicines only as told by your doctor.  If you were prescribed an antibiotic medicine, take it as told by your doctor. [name] not stop taking the antibiotic even if you start to feel better. Lifestyle   Rest as told by your doctor.  [name] not use any products that contain nicotine or tobacco, such as cigarettes, e-cigarettes, and chewing tobacco. If you need help quitting, ask your doctor.  [name] not drink alcohol.  Make lifestyle changes as told by your doctor. These may include: ? Getting regular exercise. Ask your doctor what activities are safe for you. ? Eating a heart-healthy diet. A diet and nutrition specialist (dietitian) can help you to learn healthy eating options. ? Staying at a healthy weight. ? Treating diabetes or high blood pressure, if needed. ? Lowering your stress. Activities  such as yoga and relaxation techniques can help. General instructions  Pay attention to any changes in your symptoms. Tell your doctor about them or any new symptoms.  Avoid any activities that cause chest pain.  Keep all follow-up visits as told by your doctor. This is  important. You may need more testing if your chest pain does not go away. Contact a doctor if:  Your chest pain does not go away.  You feel depressed.  You have a fever. Get help right away if:  Your chest pain is worse.  You have a cough that gets worse, or you cough up blood.  You have very bad (severe) pain in your belly (abdomen).  You pass out (faint).  You have either of these for no clear reason: ? Sudden chest discomfort. ? Sudden discomfort in your arms, back, neck, or jaw.  You have shortness of breath at any time.  You suddenly start to sweat, or your skin gets clammy.  You feel sick to your stomach (nauseous).  You throw up (vomit).  You suddenly feel lightheaded or dizzy.  You feel very weak or tired.  Your heart starts to beat fast, or it feels like it is skipping beats. These symptoms may be an emergency. [name] not wait to see if the symptoms will go away. Get medical help right away. Call your local emergency services (911 in the [place] [name] not drive yourself to the hospital. Summary  Chest pain can be caused by many different conditions. The cause may be serious and need treatment right away. If you have chest pain, see your doctor right away.  Follow your doctor's instructions for taking medicines and making lifestyle changes.  Keep all follow-up visits as told by your doctor. This includes visits for any further testing if your chest pain does not go away.  Be sure to know the signs that show that your condition has become worse. Get help right away if you have these symptoms. This information is not intended to replace advice given to you by your health care provider. Make sure you discuss any questions you have with your health care provider. Document Released: 08/14/2007 Document Revised: 08/28/2017 Document Reviewed: 08/28/2017 Elsevier Patient Education  2020 [employer]       Edwina Barth, MD [facility]

## 2020-01-27 ENCOUNTER — Emergency Department (HOSPITAL_COMMUNITY)
Admission: EM | Admit: 2020-01-27 | Discharge: 2020-01-27 | Disposition: A | Discharge status: Home / Self Care | Payer: BC Managed Care – PPO | Attending: Emergency Medicine | Admitting: Emergency Medicine

## 2020-01-27 ENCOUNTER — Emergency Department (HOSPITAL_COMMUNITY): Payer: BC Managed Care – PPO

## 2020-01-27 ENCOUNTER — Encounter (HOSPITAL_COMMUNITY): Payer: Self-pay

## 2020-01-27 ENCOUNTER — Other Ambulatory Visit: Payer: Self-pay

## 2020-01-27 ENCOUNTER — Encounter: Payer: Self-pay | Admitting: Emergency Medicine

## 2020-01-27 ENCOUNTER — Ambulatory Visit: Payer: BC Managed Care – PPO | Admitting: Emergency Medicine

## 2020-01-27 VITALS — BP 105/72 | HR 118 | Temp 97.3°F | Resp 16 | Ht 62.5 in | Wt 226.0 lb

## 2020-01-27 DIAGNOSIS — Z20822 Contact with and (suspected) exposure to covid-19: Secondary | ICD-10-CM | POA: Insufficient documentation

## 2020-01-27 DIAGNOSIS — J45909 Unspecified asthma, uncomplicated: Secondary | ICD-10-CM | POA: Insufficient documentation

## 2020-01-27 DIAGNOSIS — D72829 Elevated white blood cell count, unspecified: Secondary | ICD-10-CM | POA: Diagnosis not present

## 2020-01-27 DIAGNOSIS — R110 Nausea: Secondary | ICD-10-CM | POA: Diagnosis not present

## 2020-01-27 DIAGNOSIS — R1030 Lower abdominal pain, unspecified: Secondary | ICD-10-CM

## 2020-01-27 DIAGNOSIS — Z6841 Body Mass Index (BMI) 40.0 and over, adult: Secondary | ICD-10-CM

## 2020-01-27 DIAGNOSIS — R799 Abnormal finding of blood chemistry, unspecified: Secondary | ICD-10-CM | POA: Insufficient documentation

## 2020-01-27 DIAGNOSIS — R000 Tachycardia, unspecified: Secondary | ICD-10-CM

## 2020-01-27 DIAGNOSIS — N390 Urinary tract infection, site not specified: Secondary | ICD-10-CM | POA: Diagnosis not present

## 2020-01-27 DIAGNOSIS — F17210 Nicotine dependence, cigarettes, uncomplicated: Secondary | ICD-10-CM | POA: Insufficient documentation

## 2020-01-27 DIAGNOSIS — Z7952 Long term (current) use of systemic steroids: Secondary | ICD-10-CM | POA: Diagnosis not present

## 2020-01-27 LAB — COMPREHENSIVE METABOLIC PANEL
ALT: 13 U/L (ref 0–44)
AST: 13 U/L — ABNORMAL LOW (ref 15–41)
Albumin: 4.6 g/dL (ref 3.5–5.0)
Alkaline Phosphatase: 66 U/L (ref 38–126)
Anion gap: 10 (ref 5–15)
BUN: 8 mg/dL (ref 6–20)
CO2: 23 mmol/L (ref 22–32)
Calcium: 9.2 mg/dL (ref 8.9–10.3)
Chloride: 103 mmol/L (ref 98–111)
Creatinine, Ser: 0.62 mg/dL (ref 0.44–1.00)
GFR, Estimated: >60 mL/min (ref >60)
Glucose, Bld: 96 mg/dL (ref 70–99)
Potassium: 3.6 mmol/L (ref 3.5–5.1)
Sodium: 136 mmol/L (ref 135–145)
Total Bilirubin: 0.6 mg/dL (ref 0.3–1.2)
Total Protein: 8.5 g/dL — ABNORMAL HIGH (ref 6.5–8.1)

## 2020-01-27 LAB — POCT CBC
Granulocyte percent: 89.4 %G — AB (ref 37–80)
HCT, POC: 39.7 % (ref 29–41)
Hemoglobin: 13.2 g/dL (ref 11–14.6)
Lymph, poc: 1.7 (ref 0.6–3.4)
MCH, POC: 27.4 pg (ref 27–31.2)
MCHC: 33.3 g/dL (ref 31.8–35.4)
MCV: 82.3 fL (ref 76–111)
MID (cbc): 0.7 (ref 0–0.9)
MPV: 7.4 fL (ref 0–99.8)
POC Granulocyte: 19.8 — AB (ref 2–6.9)
POC LYMPH PERCENT: 7.6 %L — AB (ref 10–50)
POC MID %: 3 %M (ref 0–12)
Platelet Count, POC: 328 10*3/uL (ref 142–424)
RBC: 4.82 M/uL (ref 4.04–5.48)
RDW, POC: 15.3 %
WBC: 22.2 10*3/uL — AB (ref 4.6–10.2)

## 2020-01-27 LAB — CBC WITH DIFFERENTIAL/PLATELET
Abs Immature Granulocytes: 0.12 10*3/uL — ABNORMAL HIGH (ref 0.00–0.07)
Basophils Absolute: 0.1 10*3/uL (ref 0.0–0.1)
Basophils Relative: 0 %
Eosinophils Absolute: 0 10*3/uL (ref 0.0–0.5)
Eosinophils Relative: 0 %
HCT: 38.3 % (ref 36.0–46.0)
Hemoglobin: 12.8 g/dL (ref 12.0–15.0)
Immature Granulocytes: 1 %
Lymphocytes Relative: 10 %
Lymphs Abs: 2.3 10*3/uL (ref 0.7–4.0)
MCH: 27 pg (ref 26.0–34.0)
MCHC: 33.4 g/dL (ref 30.0–36.0)
MCV: 80.8 fL (ref 80.0–100.0)
Monocytes Absolute: 1 10*3/uL (ref 0.1–1.0)
Monocytes Relative: 4 %
Neutro Abs: 20.1 10*3/uL — ABNORMAL HIGH (ref 1.7–7.7)
Neutrophils Relative %: 85 %
Platelets: 313 10*3/uL (ref 150–400)
RBC: 4.74 MIL/uL (ref 3.87–5.11)
RDW: 15.2 % (ref 11.5–15.5)
WBC: 23.6 10*3/uL — ABNORMAL HIGH (ref 4.0–10.5)
nRBC: 0 % (ref 0.0–0.2)

## 2020-01-27 LAB — POCT URINE PREGNANCY: Preg Test, Ur: NEGATIVE

## 2020-01-27 LAB — URINALYSIS, ROUTINE W REFLEX MICROSCOPIC
Bilirubin Urine: NEGATIVE
Glucose, UA: NEGATIVE mg/dL
Ketones, ur: 5 mg/dL — AB
Nitrite: NEGATIVE
Protein, ur: NEGATIVE mg/dL
Specific Gravity, Urine: 1.003 — ABNORMAL LOW (ref 1.005–1.030)
pH: 6 (ref 5.0–8.0)

## 2020-01-27 LAB — POC MICROSCOPIC URINALYSIS (UMFC)

## 2020-01-27 LAB — POCT URINALYSIS DIP (MANUAL ENTRY)
Glucose, UA: NEGATIVE mg/dL
Nitrite, UA: NEGATIVE
Protein Ur, POC: 30 mg/dL — AB
Spec Grav, UA: 1.02 (ref 1.010–1.025)
Urobilinogen, UA: 2 E.U./dL — AB
pH, UA: 5.5 (ref 5.0–8.0)

## 2020-01-27 LAB — RESP PANEL BY RT-PCR (FLU A&B, COVID) ARPGX2
Influenza A by PCR: NEGATIVE
Influenza B by PCR: NEGATIVE
SARS Coronavirus 2 by RT PCR: NEGATIVE

## 2020-01-27 LAB — LIPASE, BLOOD: Lipase: 21 U/L (ref 11–51)

## 2020-01-27 MED ORDER — FLUCONAZOLE 150 MG PO TABS: ORAL_TABLET | ORAL | 0 refills | Status: DC

## 2020-01-27 MED ORDER — ONDANSETRON HCL 4 MG/2ML IJ SOLN
4.0000 mg | Freq: Once | INTRAMUSCULAR | Status: AC
  Administered 2020-01-27: 4 mg via INTRAVENOUS
  Filled 2020-01-27: qty 2

## 2020-01-27 MED ORDER — FENTANYL CITRATE (PF) 100 MCG/2ML IJ SOLN
100.0000 ug | INTRAMUSCULAR | Status: AC | PRN
  Administered 2020-01-27 (×2): 100 ug via INTRAVENOUS
  Filled 2020-01-27 (×2): qty 2

## 2020-01-27 MED ORDER — SODIUM CHLORIDE (PF) 0.9 % IJ SOLN
INTRAMUSCULAR | Status: AC
  Filled 2020-01-27: qty 50

## 2020-01-27 MED ORDER — AMOXICILLIN-POT CLAVULANATE 875-125 MG PO TABS: 1.0000 | ORAL_TABLET | Freq: Two times a day (BID) | ORAL | 0 refills | Status: DC

## 2020-01-27 MED ORDER — SODIUM CHLORIDE 0.9 % IV SOLN
1.0000 g | Freq: Once | INTRAVENOUS | Status: AC
  Administered 2020-01-27: 1 g via INTRAVENOUS
  Filled 2020-01-27: qty 10

## 2020-01-27 MED ORDER — IOHEXOL 300 MG/ML  SOLN
100.0000 mL | Freq: Once | INTRAMUSCULAR | Status: AC | PRN
  Administered 2020-01-27: 100 mL via INTRAVENOUS

## 2020-01-27 NOTE — ED Triage Notes (Addendum)
Patient c/o lower abdominal pain and nausea since yesterday.  Patient reports that she went to her PCP today and was sent to the ED for possible appendicitis. Patient's [id number].

## 2020-01-27 NOTE — Progress Notes (Signed)
[name] 34 y.o.   Chief Complaint  Patient presents with  . Abdominal Pain    per patient real bad pain and hurts to move or sit started yesterday  . Nausea    patient denies vomiting    HISTORY OF PRESENT ILLNESS: This is a 34 y.o. female complaining of steady lower abdominal pain that started yesterday at 2 in the afternoon and has been progressively getting worse.  Nauseous but no vomiting.  Denies urinary symptoms.  Lack of appetite.  No bowel movement today.  Denies fever but some chills.  Presently not sexually active, last intercourse last July.  Denies vaginal discharge or abnormal bleeding.  Denies blood in the urine.  No other significant symptoms.  HPI   Prior to Admission medications   Medication Sig Start Date End Date Taking? Authorizing Provider  acetaminophen (TYLENOL) 325 MG tablet Take 650 mg by mouth every 6 (six) hours as needed for pain.   Yes [provider]  albuterol (PROVENTIL HFA;VENTOLIN HFA) 108 (90 Base) MCG/ACT inhaler Inhale 2 puffs into the lungs every 6 (six) hours as needed for wheezing or shortness of breath. 09/30/17  Yes Takela Varden, Eilleen KempfMiguel Jose, MD  cetirizine (ZYRTEC) 10 MG chewable tablet Chew 10 mg by mouth daily.   Yes [provider]  fluticasone (FLONASE) 50 MCG/ACT nasal spray Place 2 sprays into both nostrils at bedtime. 12/30/15  Yes Sherren MochaShaw, Eva N, MD  Menthol, Topical Analgesic, (BENGAY COLD THERAPY EX) Apply 1 application topically as needed (muscle cramp).   Yes [provider]  acidophilus (RISAQUAD) CAPS capsule Take 1 capsule by mouth daily. Patient not taking: Reported on 01/27/2020 08/22/17   Joseph ArtVann, Jessica U, DO  ondansetron (ZOFRAN) 4 MG tablet Take 1 tablet (4 mg total) by mouth every 8 (eight) hours as needed for nausea or vomiting. Patient not taking: Reported on 12/24/2018 08/21/17   Joseph ArtVann, Jessica U, DO    No Known Allergies  Patient Active Problem List   Diagnosis Date Noted  . History of asthma  09/30/2017  . Tobacco use disorder 04/13/2017  . Obesity (BMI 30-39.9) 09/13/2014    Past Medical History:  Diagnosis Date  . Allergy   . Asthma   . Seasonal allergies     Past Surgical History:  Procedure Laterality Date  . DENTAL SURGERY      Social History   Socioeconomic History  . Marital status: Single    Spouse name: Not on file  . Number of children: Not on file  . Years of education: Not on file  . Highest education level: Not on file  Occupational History  . Not on file  Tobacco Use  . Smoking status: Current Every Day Smoker    Packs/day: 0.50    Years: 4.00    Pack years: 2.00    Types: Cigarettes  . Smokeless tobacco: Never Used  Vaping Use  . Vaping Use: Former  Substance and Sexual Activity  . Alcohol use: Yes    Alcohol/week: 4.0 standard drinks    Types: 2 Glasses of wine, 2 Standard drinks or equivalent per week  . Drug use: No  . Sexual activity: Yes    Birth control/protection: Implant  Other Topics Concern  . Not on file  Social History Narrative  . Not on file   Social Determinants of Health   Financial Resource Strain:   . Difficulty of Paying Living Expenses: Not on file  Food Insecurity:   . Worried About Radiation protection practitionerunning Out  of Food in the Last Year: Not on file  . Ran Out of Food in the Last Year: Not on file  Transportation Needs:   . Lack of Transportation (Medical): Not on file  . Lack of Transportation (Non-Medical): Not on file  Physical Activity:   . Days of Exercise per Week: Not on file  . Minutes of Exercise per Session: Not on file  Stress:   . Feeling of Stress : Not on file  Social Connections:   . Frequency of Communication with Friends and Family: Not on file  . Frequency of Social Gatherings with Friends and Family: Not on file  . Attends Religious Services: Not on file  . Active Member of Clubs or Organizations: Not on file  . Attends Banker Meetings: Not on file  . Marital Status: Not on file    Intimate Partner Violence:   . Fear of Current or Ex-Partner: Not on file  . Emotionally Abused: Not on file  . Physically Abused: Not on file  . Sexually Abused: Not on file    Family History  Problem Relation Age of Onset  . Cancer Father        prostate   . Liver disease Father   . Liver disease Maternal Grandfather      Review of Systems  Constitutional: Positive for chills. Negative for fever and malaise/fatigue.  HENT: Negative.  Negative for congestion and sore throat.   Respiratory: Negative.  Negative for cough and shortness of breath.   Cardiovascular: Negative.  Negative for chest pain and palpitations.  Gastrointestinal: Positive for abdominal pain and nausea. Negative for blood in stool, diarrhea, melena and vomiting.  Genitourinary: Negative.  Negative for dysuria, flank pain, frequency, hematuria and urgency.  Musculoskeletal: Negative.   Skin: Negative.  Negative for rash.  Neurological: Negative.  Negative for dizziness and headaches.  All other systems reviewed and are negative.   Vitals:   01/27/20 1429  BP: 105/72  Pulse: (!) 118  Resp: 16  Temp: (!) 97.3 F (36.3 C)  SpO2: 97%    Physical Exam Constitutional:      Appearance: She is obese.  HENT:     Head: Normocephalic.  Eyes:     Extraocular Movements: Extraocular movements intact.     Pupils: Pupils are equal, round, and reactive to light.  Cardiovascular:     Rate and Rhythm: Normal rate and regular rhythm.     Pulses: Normal pulses.     Heart sounds: Normal heart sounds.  Pulmonary:     Effort: Pulmonary effort is normal.     Breath sounds: Normal breath sounds.  Abdominal:     General: Bowel sounds are normal.     Palpations: Abdomen is soft.     Tenderness: There is abdominal tenderness in the right lower quadrant and left lower quadrant. There is guarding. There is no right CVA tenderness, left CVA tenderness or rebound. Positive signs include McBurney's sign.  Musculoskeletal:         General: Normal range of motion.     Cervical back: Normal range of motion.  Skin:    General: Skin is warm and dry.  Neurological:     General: No focal deficit present.     Mental Status: She is alert and oriented to person, place, and time.  Psychiatric:        Mood and Affect: Mood normal.        Behavior: Behavior normal.     Results  for orders placed or performed in visit on 01/27/20 (from the past 24 hour(s))  POCT urinalysis dipstick     Status: Abnormal   Collection Time: 01/27/20  2:36 PM  Result Value Ref Range   Color, UA yellow yellow   Clarity, UA clear clear   Glucose, UA negative negative mg/dL   Bilirubin, UA small (A) negative   Ketones, POC UA moderate (40) (A) negative mg/dL   Spec Grav, UA 1.610 9.604 - 1.025   Blood, UA small (A) negative   pH, UA 5.5 5.0 - 8.0   Protein Ur, POC =30 (A) negative mg/dL   Urobilinogen, UA 2.0 (A) 0.2 or 1.0 E.U./dL   Nitrite, UA Negative Negative   Leukocytes, UA Large (3+) (A) Negative  POCT urine pregnancy     Status: Normal   Collection Time: 01/27/20  2:36 PM  Result Value Ref Range   Preg Test, Ur Negative Negative  POCT CBC     Status: Abnormal   Collection Time: 01/27/20  2:38 PM  Result Value Ref Range   WBC 22.2 (A) 4.6 - 10.2 K/uL   Lymph, poc 1.7 0.6 - 3.4   POC LYMPH PERCENT 7.6 (A) 10 - 50 %L   MID (cbc) 0.7 0 - 0.9   POC MID % 3.0 0 - 12 %M   POC Granulocyte 19.8 (A) 2 - 6.9   Granulocyte percent 89.4 (A) 37 - 80 %G   RBC 4.82 4.04 - 5.48 M/uL   Hemoglobin 13.2 11 - 14.6 g/dL   HCT, POC 54.0 29 - 41 %   MCV 82.3 76 - 111 fL   MCH, POC 27.4 27 - 31.2 pg   MCHC 33.3 31.8 - 35.4 g/dL   RDW, POC 98.1 %   Platelet Count, POC 328 142 - 424 K/uL   MPV 7.4 0 - 99.8 fL    ASSESSMENT & PLAN: Quinnley was seen today for abdominal pain and nausea.  Diagnoses and all orders for this visit:  Lower abdominal pain Comments: Suspected appendicitis Orders: -     POCT Microscopic Urinalysis ([facility]) -      POCT urinalysis dipstick -     POCT urine pregnancy -     Urine Culture -     POCT CBC -     Comprehensive metabolic panel  Body mass index (BMI) of 40.1-44.9 in adult (HCC)  Leukocytosis, unspecified type  Tachycardia    Patient Instructions   Go to the emergency department at [facility] now for further evaluation of lower abdominal pain, suspected appendicitis.    If you have lab work done today you will be contacted with your lab results within the next 2 weeks.  If you have not heard from [place] then please contact us. The fastest way to get your results is to register for My Chart.   IF you received an x-ray today, you will receive an invoice from [facility]. Please contact [facility] at [phone] with questions or concerns regarding your invoice.   IF you received labwork today, you will receive an invoice from [place]. Please contact [facility] at [phone] with questions or concerns regarding your invoice.   Our billing staff will not be able to assist you with questions regarding bills from these companies.  You will be contacted with the lab results as soon as they are available. The fastest way to get your results is to activate your My Chart account. Instructions are located on the last page  of this paperwork. If you have not heard from [place] regarding the results in 2 weeks, please contact this office.     Appendicitis, Adult  The appendix is a tube in the body that is shaped like a finger. It is attached to the large intestine. Appendicitis means that this tube is swollen (inflamed). If this is not treated, the tube can tear (rupture). This can lead to a life-threatening infection. This condition can also cause pus to build up in the appendix (abscess). What are the causes? This condition may be caused by something that blocks the appendix. These include:  A ball of poop (stool).  Lymph glands that are bigger than normal. Sometimes  the cause is not known. What increases the risk? You are more likely to develop this condition if you are between 89 and 68 years of age. What are the signs or symptoms? Symptoms of this condition include:  Pain around the belly button (navel). ? The pain moves toward the lower right belly (abdomen). ? The pain can get worse with time. ? The pain can get worse if you cough. ? The pain can get worse if you move suddenly.  Tenderness in the lower right belly.  Feeling sick to your stomach (nauseous).  Throwing up (vomiting).  Not feeling hungry (loss of appetite).  A fever.  Having trouble pooping (constipation).  Watery poop (diarrhea).  Not feeling well. How is this treated? Most often, this condition is treated by taking out the appendix (appendectomy). There are two ways to do this:  Open surgery. For this method, the appendix is taken out through a large cut (incision). The cut is made in the lower right belly. This surgery may be used if: ? You have scars from another surgery. ? You have a bleeding condition. ? You are pregnant and will be having your baby soon. ? You have a condition that makes it hard to do the other type of surgery.  Laparoscopic surgery. For this method, the appendix is taken out through small cuts. Often, this surgery: ? Causes less pain. ? Causes fewer problems. ? Is easier to heal from. If your appendix tears and pus forms:  A drain may be put into the sore. The drain will be used to get rid of the pus.  You may get an antibiotic medicine through an IV line.  Your appendix may or may not need to be taken out. Follow these instructions at home: If you had surgery, follow instructions from your doctor on how to care for yourself at home and how to take care of your cut from surgery. Medicines  Take over-the-counter and prescription medicines only as told by your doctor.  If you were prescribed an antibiotic medicine, take it as told by  your doctor. Do not stop taking the antibiotic even if you start to feel better. Eating and drinking Follow instructions from your doctor about what you cannot eat or drink. You may go back to your diet slowly if:  You no longer feel sick to your stomach.  You have stopped throwing up. General instructions  Do not use any products that contain nicotine or tobacco, such as cigarettes, e-cigarettes, and chewing tobacco. If you need help quitting, ask your doctor.  Do not drive or use heavy machinery while taking prescription pain medicine.  Ask your doctor if the medicine you are taking can cause trouble pooping. You may need to take steps to prevent or treat trouble pooping: ? Drink enough  fluid to keep your pee (urine) pale yellow. ? Take over-the-counter or prescription medicines. ? Eat foods that are high in fiber. These include beans, whole grains, and fresh fruits and vegetables. ? Limit foods that are high in fat and sugar. These include fried or sweet foods.  Keep all follow-up visits as told by your doctor. This is important. Contact a doctor if:  There is pus, blood, or a lot of fluid coming from your cut or cuts from surgery.  You are sick to your stomach or you throw up. Get help right away if:  You have pain in your belly, and the pain is getting worse.  You have a fever.  You have chills.  You are very tired.  You have muscle pain.  You are short of breath. Summary  Appendicitis is swelling of the appendix. The appendix is a tube that is shaped like a finger. It is joined to the large intestine.  This condition may be caused by something that blocks the appendix. This can lead to an infection.  This condition is usually treated by taking out the appendix. This information is not intended to replace advice given to you by your health care provider. Make sure you discuss any questions you have with your health care provider. Document Revised: 08/13/2017  Document Reviewed: 08/13/2017 Elsevier Patient Education  2020 [employer]      Edwina Barth, MD [facility]

## 2020-01-27 NOTE — ED Notes (Signed)
Discharge instructions discussed with patient. Verbalized understanding. Departs ED at this time in stable condition.  

## 2020-01-27 NOTE — Discharge Instructions (Signed)
Your pain is likely from a urinary tract infection.  We started an antibiotic today, and sent a prescription to your pharmacy.  Start taking the prescription in the morning.  Get plenty of rest, and drink fluids such as water to improve your hydration status.  Use Tylenol or Motrin for pain.  See your primary care doctor for checkup in a week or so and have them recheck your urine.  Return here if needed for problems.

## 2020-01-27 NOTE — ED Provider Notes (Signed)
[facility]   CSN: [id number] Arrival date & time: 01/27/20  1505     History Chief Complaint  Patient presents with  . Abdominal Pain  . Nausea  . Abnormal Lab    [name] is a 34 y.o. female.  HPI She was seen by her PCP today for lower abdominal pain which started this morning, and has been persistent.  Pain is moderately severe.  She did not eat today.  She denies vomiting, fever at home, weakness or dizziness.  No prior similar problems.  No current vaginal bleeding, dysuria, urinary frequency, back pain, weakness or dizziness.  There are no other known modifying factors.    Past Medical History:  Diagnosis Date  . Allergy   . Asthma   . Seasonal allergies     Patient Active Problem List   Diagnosis Date Noted  . History of asthma 09/30/2017  . Tobacco use disorder 04/13/2017  . Obesity (BMI 30-39.9) 09/13/2014    Past Surgical History:  Procedure Laterality Date  . DENTAL SURGERY       OB History   No obstetric history on file.     Family History  Problem Relation Age of Onset  . Cancer Father        prostate   . Liver disease Father   . Liver disease Maternal Grandfather     Social History   Tobacco Use  . Smoking status: Current Every Day Smoker    Packs/day: 0.50    Years: 4.00    Pack years: 2.00    Types: Cigarettes  . Smokeless tobacco: Never Used  Vaping Use  . Vaping Use: Never used  Substance Use Topics  . Alcohol use: Yes    Alcohol/week: 4.0 standard drinks    Types: 2 Glasses of wine, 2 Standard drinks or equivalent per week  . Drug use: No    Home Medications Prior to Admission medications   Medication Sig Start Date End Date Taking? Authorizing Provider  acetaminophen (TYLENOL) 325 MG tablet Take 650 mg by mouth every 6 (six) hours as needed for pain.   Yes [provider]  albuterol (PROVENTIL HFA;VENTOLIN HFA) 108 (90 Base) MCG/ACT inhaler Inhale 2 puffs  into the lungs every 6 (six) hours as needed for wheezing or shortness of breath. 09/30/17  Yes Sagardia, Eilleen KempfMiguel Jose, MD  cetirizine (ZYRTEC) 10 MG chewable tablet Chew 10 mg by mouth daily as needed for allergies.    Yes [provider]  fluticasone (FLONASE) 50 MCG/ACT nasal spray Place 2 sprays into both nostrils at bedtime. Patient taking differently: Place 2 sprays into both nostrils daily as needed for allergies.  12/30/15  Yes Sherren MochaShaw, Eva N, MD  Menthol, Topical Analgesic, (BENGAY COLD THERAPY EX) Apply 1 application topically as needed (muscle cramp).   Yes [provider]  amoxicillin-clavulanate (AUGMENTIN) 875-125 MG tablet Take 1 tablet by mouth 2 (two) times daily. One po bid x 7 days 01/27/20   Mancel BaleWentz, Natha Guin, MD    Allergies    Patient has no known allergies.  Review of Systems   Review of Systems  All other systems reviewed and are negative.   Physical Exam Updated Vital Signs BP 129/75   Pulse 88   Temp 98.5 F (36.9 C) (Oral)   Resp 19   Ht 5' 2.5" (1.588 m)   Wt 102.5 kg   LMP 01/22/2020 Comment: neg preg test today  SpO2 94%   BMI 40.68  kg/m   Physical Exam Vitals and nursing note reviewed.  Constitutional:      General: She is not in acute distress.    Appearance: She is well-developed. She is obese. She is not ill-appearing, toxic-appearing or diaphoretic.  HENT:     Head: Normocephalic and atraumatic.     Right Ear: External ear normal.     Left Ear: External ear normal.  Eyes:     Conjunctiva/sclera: Conjunctivae normal.     Pupils: Pupils are equal, round, and reactive to light.  Neck:     Trachea: Phonation normal.  Cardiovascular:     Rate and Rhythm: Normal rate and regular rhythm.     Heart sounds: Normal heart sounds.  Pulmonary:     Effort: Pulmonary effort is normal.     Breath sounds: Normal breath sounds.  Abdominal:     General: There is no distension.     Palpations: Abdomen is soft. There is no mass.      Tenderness: There is abdominal tenderness (Bilateral lower quadrants left greater than right). There is no guarding.  Musculoskeletal:        General: Normal range of motion.     Cervical back: Normal range of motion and neck supple.  Skin:    General: Skin is warm and dry.  Neurological:     Mental Status: She is alert and oriented to person, place, and time.     Cranial Nerves: No cranial nerve deficit.     Sensory: No sensory deficit.     Motor: No abnormal muscle tone.     Coordination: Coordination normal.  Psychiatric:        Mood and Affect: Mood normal.        Behavior: Behavior normal.        Thought Content: Thought content normal.        Judgment: Judgment normal.     ED Results / Procedures / Treatments   Labs (all labs ordered are listed, but only abnormal results are displayed) Labs Reviewed  CBC WITH DIFFERENTIAL/PLATELET - Abnormal; Notable for the following components:      Result Value   WBC 23.6 (*)    Neutro Abs 20.1 (*)    Abs Immature Granulocytes 0.12 (*)    All other components within normal limits  COMPREHENSIVE METABOLIC PANEL - Abnormal; Notable for the following components:   Total Protein 8.5 (*)    AST 13 (*)    All other components within normal limits  URINALYSIS, ROUTINE W REFLEX MICROSCOPIC - Abnormal; Notable for the following components:   APPearance HAZY (*)    Specific Gravity, Urine 1.003 (*)    Hgb urine dipstick SMALL (*)    Ketones, ur 5 (*)    Leukocytes,Ua MODERATE (*)    Bacteria, UA RARE (*)    All other components within normal limits  RESP PANEL BY RT-PCR (FLU A&B, COVID) ARPGX2  URINE CULTURE  LIPASE, BLOOD    EKG None  Radiology CT Abdomen Pelvis W Contrast  Result Date: 01/27/2020 CLINICAL DATA:  Right lower quadrant abdominal pain EXAM: CT ABDOMEN AND PELVIS WITH CONTRAST TECHNIQUE: Multidetector CT imaging of the abdomen and pelvis was performed using the standard protocol following bolus administration of  intravenous contrast. CONTRAST:  OMNIPAQUE IOHEXOL 300 MG/ML  SOLN COMPARISON:  None. FINDINGS: Lower chest: The visualized heart size within normal limits. No pericardial fluid/thickening. No hiatal hernia. The visualized portions of the lungs are clear. Hepatobiliary: The liver is normal  in density without focal abnormality.The main portal vein is patent. No evidence of calcified gallstones, gallbladder wall thickening or biliary dilatation. Pancreas: Unremarkable. No pancreatic ductal dilatation or surrounding inflammatory changes. Spleen: Normal in size without focal abnormality. Adrenals/Urinary Tract: Both adrenal glands appear normal. The kidneys and collecting system appear normal without evidence of urinary tract calculus or hydronephrosis. Bladder is unremarkable. Stomach/Bowel: The stomach, small bowel, and colon are normal in appearance. No inflammatory changes, wall thickening, or obstructive findings. Scattered diverticula are noted.The appendix is normal. Vascular/Lymphatic: There are no enlarged mesenteric, retroperitoneal, or pelvic lymph nodes. No significant vascular findings are present. Reproductive: Within the right adnexa there is a 2.5 cm low-density lesion could represent a right ovarian cyst/dominant follicle. Other: No evidence of abdominal wall mass or hernia. Musculoskeletal: No acute or significant osseous findings. IMPRESSION: Normal appearing appendix. Right ovarian simple cyst versus dominant follicle measuring 2.5 cm. Electronically Signed   By: Jonna Clark M.D.   On: 01/27/2020 17:23    Procedures Procedures (including critical care time)  Medications Ordered in ED Medications  sodium chloride (PF) 0.9 % injection (  Given 01/27/20 1646)  iohexol (OMNIPAQUE) 300 MG/ML solution 100 mL (100 mLs Intravenous Contrast Given 01/27/20 1631)  ondansetron (ZOFRAN) injection 4 mg (4 mg Intravenous Given 01/27/20 1654)  fentaNYL (SUBLIMAZE) injection 100 mcg (100 mcg  Intravenous Given 01/27/20 1817)  cefTRIAXone (ROCEPHIN) 1 g in sodium chloride 0.9 % 100 mL IVPB (1 g Intravenous New Bag/Given 01/27/20 1811)    ED Course  I have reviewed the triage vital signs and the nursing notes.  Pertinent labs & imaging results that were available during my care of the patient were reviewed by me and considered in my medical decision making (see chart for details).    MDM Rules/Calculators/A&P                           Patient Vitals for the past 24 hrs:  BP Temp Temp src Pulse Resp SpO2 Height Weight  01/27/20 1800 129/75 -- -- 88 19 94 % -- --  01/27/20 1730 (!) 124/57 -- -- 93 17 96 % -- --  01/27/20 1700 (!) 117/93 -- -- 98 16 95 % -- --  01/27/20 1600 118/69 -- -- 98 17 98 % -- --  01/27/20 1513 -- -- -- -- -- -- 5' 2.5" (1.588 m) 102.5 kg  01/27/20 1510 (!) 149/104 98.5 F (36.9 C) Oral (!) 118 16 97 % -- --    6:05 PM Reevaluation with update and discussion. After initial assessment and treatment, an updated evaluation reveals she is comfortable now has no further complaints.  Findings discussed with patient all questions were answered. Mancel Bale   Medical Decision Making:  This patient is presenting for evaluation of abdominal pain, which does require a range of treatment options, and is a complaint that involves a high risk of morbidity and mortality. The differential diagnoses include appendicitis, UTI, colitis. I decided to review old records, and in summary Young female presenting with low abdominal pain and nausea.  I did not require additional historical information from anyone.  Clinical Laboratory Tests Ordered, included CBC, Metabolic panel, Urinalysis and Lipase, Covid test. Review indicates elevated white count, abnormal urinalysis, normal lipase. Radiologic Tests Ordered, included CT abdomen pelvis, no acute abnormality, no appendicitis.    Critical Interventions-clinical evaluation, laboratory testing, CT imaging, IV fluids,  observation, IV antibiotic, reassessment  After These Interventions, the Patient  was reevaluated and was found stable for discharge.  CT does not indicate acute surgical abnormality including appendicitis.  Urinalysis abnormal, likely cause of patient's discomfort.  Initiated treatment with Rocephin IV in the ED, prescription sent to her pharmacy.  CRITICAL CARE-no Performed by: Mancel Bale  Nursing Notes Reviewed/ Care Coordinated Applicable Imaging Reviewed Interpretation of Laboratory Data incorporated into ED treatment  The patient appears reasonably screened and/or stabilized for discharge and I doubt any other medical condition or other [facility] requiring further screening, evaluation, or treatment in the ED at this time prior to discharge.  Plan: Home Medications-OTC symptomatic treatment; Home Treatments-rest, fluids; return here if the recommended treatment, does not improve the symptoms; Recommended follow up-PCP follow-up 1 week and as needed     Final Clinical Impression(s) / ED Diagnoses Final diagnoses:  Lower urinary tract infectious disease    Rx / DC Orders ED Discharge Orders         Ordered    amoxicillin-clavulanate (AUGMENTIN) 875-125 MG tablet  2 times daily        01/27/20 1813           Mancel Bale, MD 01/27/20 1843

## 2020-01-27 NOTE — Patient Instructions (Addendum)
Go to the emergency department at [facility] now for further evaluation of lower abdominal pain, suspected appendicitis.    If you have lab work done today you will be contacted with your lab results within the next 2 weeks.  If you have not heard from [place] then please contact us. The fastest way to get your results is to register for My Chart.   IF you received an x-ray today, you will receive an invoice from [facility]. Please contact [facility] at [phone] with questions or concerns regarding your invoice.   IF you received labwork today, you will receive an invoice from [facility]. Please contact [facility] at [phone] with questions or concerns regarding your invoice.   Our billing staff will not be able to assist you with questions regarding bills from these companies.  You will be contacted with the lab results as soon as they are available. The fastest way to get your results is to activate your My Chart account. Instructions are located on the last page of this paperwork. If you have not heard from [place] regarding the results in 2 weeks, please contact this office.     Appendicitis, Adult  The appendix is a tube in the body that is shaped like a finger. It is attached to the large intestine. Appendicitis means that this tube is swollen (inflamed). If this is not treated, the tube can tear (rupture). This can lead to a life-threatening infection. This condition can also cause pus to build up in the appendix (abscess). What are the causes? This condition may be caused by something that blocks the appendix. These include:  A ball of poop (stool).  Lymph glands that are bigger than normal. Sometimes the cause is not known. What increases the risk? You are more likely to develop this condition if you are between 19 and 50 years of age. What are the signs or symptoms? Symptoms of this condition include:  Pain around the belly button (navel). ? The pain  moves toward the lower right belly (abdomen). ? The pain can get worse with time. ? The pain can get worse if you cough. ? The pain can get worse if you move suddenly.  Tenderness in the lower right belly.  Feeling sick to your stomach (nauseous).  Throwing up (vomiting).  Not feeling hungry (loss of appetite).  A fever.  Having trouble pooping (constipation).  Watery poop (diarrhea).  Not feeling well. How is this treated? Most often, this condition is treated by taking out the appendix (appendectomy). There are two ways to do this:  Open surgery. For this method, the appendix is taken out through a large cut (incision). The cut is made in the lower right belly. This surgery may be used if: ? You have scars from another surgery. ? You have a bleeding condition. ? You are pregnant and will be having your baby soon. ? You have a condition that makes it hard to do the other type of surgery.  Laparoscopic surgery. For this method, the appendix is taken out through small cuts. Often, this surgery: ? Causes less pain. ? Causes fewer problems. ? Is easier to heal from. If your appendix tears and pus forms:  A drain may be put into the sore. The drain will be used to get rid of the pus.  You may get an antibiotic medicine through an IV line.  Your appendix may or may not need to be taken out. Follow these instructions at home: If  you had surgery, follow instructions from your doctor on how to care for yourself at home and how to take care of your cut from surgery. Medicines  Take over-the-counter and prescription medicines only as told by your doctor.  If you were prescribed an antibiotic medicine, take it as told by your doctor. Do not stop taking the antibiotic even if you start to feel better. Eating and drinking Follow instructions from your doctor about what you cannot eat or drink. You may go back to your diet slowly if:  You no longer feel sick to your  stomach.  You have stopped throwing up. General instructions  Do not use any products that contain nicotine or tobacco, such as cigarettes, e-cigarettes, and chewing tobacco. If you need help quitting, ask your doctor.  Do not drive or use heavy machinery while taking prescription pain medicine.  Ask your doctor if the medicine you are taking can cause trouble pooping. You may need to take steps to prevent or treat trouble pooping: ? Drink enough fluid to keep your pee (urine) pale yellow. ? Take over-the-counter or prescription medicines. ? Eat foods that are high in fiber. These include beans, whole grains, and fresh fruits and vegetables. ? Limit foods that are high in fat and sugar. These include fried or sweet foods.  Keep all follow-up visits as told by your doctor. This is important. Contact a doctor if:  There is pus, blood, or a lot of fluid coming from your cut or cuts from surgery.  You are sick to your stomach or you throw up. Get help right away if:  You have pain in your belly, and the pain is getting worse.  You have a fever.  You have chills.  You are very tired.  You have muscle pain.  You are short of breath. Summary  Appendicitis is swelling of the appendix. The appendix is a tube that is shaped like a finger. It is joined to the large intestine.  This condition may be caused by something that blocks the appendix. This can lead to an infection.  This condition is usually treated by taking out the appendix. This information is not intended to replace advice given to you by your health care provider. Make sure you discuss any questions you have with your health care provider. Document Revised: 08/13/2017 Document Reviewed: 08/13/2017 Elsevier Patient Education  2020 [employer].

## 2020-01-27 NOTE — Progress Notes (Signed)
22.2aw

## 2020-01-28 LAB — URINE CULTURE: Culture: <10000 — AB

## 2020-01-28 LAB — COMPREHENSIVE METABOLIC PANEL
ALT: 10 IU/L (ref 0–32)
AST: 11 IU/L (ref 0–40)
Albumin/Globulin Ratio: 1.6 (ref 1.2–2.2)
Albumin: 4.8 g/dL (ref 3.8–4.8)
Alkaline Phosphatase: 88 IU/L (ref 44–121)
BUN/Creatinine Ratio: 12 (ref 9–23)
BUN: 7 mg/dL (ref 6–20)
Bilirubin Total: 0.5 mg/dL (ref 0.0–1.2)
CO2: 21 mmol/L (ref 20–29)
Calcium: 9.6 mg/dL (ref 8.7–10.2)
Chloride: 101 mmol/L (ref 96–106)
Creatinine, Ser: 0.59 mg/dL (ref 0.57–1.00)
GFR calc Af Amer: 140 mL/min/{1.73_m2} (ref >59)
GFR calc non Af Amer: 122 mL/min/{1.73_m2} (ref >59)
Globulin, Total: 3 g/dL (ref 1.5–4.5)
Glucose: 80 mg/dL (ref 65–99)
Potassium: 3.8 mmol/L (ref 3.5–5.2)
Sodium: 137 mmol/L (ref 134–144)
Total Protein: 7.8 g/dL (ref 6.0–8.5)

## 2020-01-29 LAB — URINE CULTURE

## 2020-02-08 ENCOUNTER — Ambulatory Visit: Payer: BC Managed Care – PPO | Admitting: Emergency Medicine

## 2020-02-08 ENCOUNTER — Encounter: Payer: Self-pay | Admitting: Emergency Medicine

## 2020-02-08 ENCOUNTER — Other Ambulatory Visit: Payer: Self-pay

## 2020-02-08 VITALS — BP 113/76 | HR 90 | Temp 97.9°F | Resp 16 | Ht 62.5 in | Wt 226.0 lb

## 2020-02-08 DIAGNOSIS — R1030 Lower abdominal pain, unspecified: Secondary | ICD-10-CM

## 2020-02-08 LAB — POC MICROSCOPIC URINALYSIS (UMFC)

## 2020-02-08 LAB — POCT URINALYSIS DIP (MANUAL ENTRY)
Bilirubin, UA: NEGATIVE
Blood, UA: NEGATIVE
Glucose, UA: NEGATIVE mg/dL
Nitrite, UA: NEGATIVE
Protein Ur, POC: NEGATIVE mg/dL
Spec Grav, UA: 1.02 (ref 1.010–1.025)
Urobilinogen, UA: 0.2 E.U./dL
pH, UA: 5 (ref 5.0–8.0)

## 2020-02-08 MED ORDER — FLUCONAZOLE 150 MG PO TABS: 150.0000 mg | ORAL_TABLET | Freq: Once | ORAL | 0 refills | Status: DC

## 2020-02-08 NOTE — Patient Instructions (Addendum)
   If you have lab work done today you will be contacted with your lab results within the next 2 weeks.  If you have not heard from us then please contact us. The fastest way to get your results is to register for My Chart.   IF you received an x-ray today, you will receive an invoice from [facility]. Please contact [facility] at [phone] with questions or concerns regarding your invoice.   IF you received labwork today, you will receive an invoice from [facility]. Please contact [facility] at [phone] with questions or concerns regarding your invoice.   Our billing staff will not be able to assist you with questions regarding bills from these companies.  You will be contacted with the lab results as soon as they are available. The fastest way to get your results is to activate your My Chart account. Instructions are located on the last page of this paperwork. If you have not heard from us regarding the results in 2 weeks, please contact this office.     Abdominal Pain, Adult Many things can cause belly (abdominal) pain. Most times, belly pain is not dangerous. Many cases of belly pain can be watched and treated at home. Sometimes, though, belly pain is serious. Your doctor will try to find the cause of your belly pain. Follow these instructions at home:  Medicines  Take over-the-counter and prescription medicines only as told by your doctor.  Do not take medicines that help you poop (laxatives) unless told by your doctor. General instructions  Watch your belly pain for any changes.  Drink enough fluid to keep your pee (urine) pale yellow.  Keep all follow-up visits as told by your doctor. This is important. Contact a doctor if:  Your belly pain changes or gets worse.  You are not hungry, or you lose weight without trying.  You are having trouble pooping (constipated) or have watery poop (diarrhea) for more than 2-3 days.  You have pain when you pee  or poop.  Your belly pain wakes you up at night.  Your pain gets worse with meals, after eating, or with certain foods.  You are vomiting and cannot keep anything down.  You have a fever.  You have blood in your pee. Get help right away if:  Your pain does not go away as soon as your doctor says it should.  You cannot stop vomiting.  Your pain is only in areas of your belly, such as the right side or the left lower part of the belly.  You have bloody or black poop, or poop that looks like tar.  You have very bad pain, cramping, or bloating in your belly.  You have signs of not having enough fluid or water in your body (dehydration), such as: ? Dark pee, very little pee, or no pee. ? Cracked lips. ? Dry mouth. ? Sunken eyes. ? Sleepiness. ? Weakness.  You have trouble breathing or chest pain. Summary  Many cases of belly pain can be watched and treated at home.  Watch your belly pain for any changes.  Take over-the-counter and prescription medicines only as told by your doctor.  Contact a doctor if your belly pain changes or gets worse.  Get help right away if you have very bad pain, cramping, or bloating in your belly. This information is not intended to replace advice given to you by your health care provider. Make sure you discuss any questions you have with your health   care provider. Document Revised: 07/06/2018 Document Reviewed: 07/06/2018 Elsevier Patient Education  2020 [employer]  

## 2020-02-08 NOTE — Progress Notes (Signed)
[name] 34 y.o.   Chief Complaint  Patient presents with  . Abdominal Pain    per patient pain is not as bad, but still uncomfortable on lower left area       HISTORY OF PRESENT ILLNESS: This is a 34 y.o. female here for follow-up of abdominal pain.  Seen by me on 01/27/2020 with abdominal pain and leukocytosis.  Went to the emergency department for further evaluation and work-up.  2 urine cultures came back basically normal. CT scan of abdomen and pelvis mostly normal except for right-sided ovarian cyst. Just finished course of antibiotics.  Still having vaginal yeast infection.  Requesting medication. Feels about 80% better.  Able to eat and drink.  Denies nausea or vomiting.  Denies fever or chills.  Denies urinary symptoms. No other complaints or medical concerns today.  HPI   Prior to Admission medications   Medication Sig Start Date End Date Taking? Authorizing Provider  acetaminophen (TYLENOL) 325 MG tablet Take 650 mg by mouth every 6 (six) hours as needed for pain.   Yes [provider]  albuterol (PROVENTIL HFA;VENTOLIN HFA) 108 (90 Base) MCG/ACT inhaler Inhale 2 puffs into the lungs every 6 (six) hours as needed for wheezing or shortness of breath. 09/30/17  Yes Takeyah Wieman, Eilleen KempfMiguel Jose, MD  cetirizine (ZYRTEC) 10 MG chewable tablet Chew 10 mg by mouth daily as needed for allergies.    Yes [provider]  fluticasone (FLONASE) 50 MCG/ACT nasal spray Place 2 sprays into both nostrils at bedtime. Patient taking differently: Place 2 sprays into both nostrils daily as needed for allergies.  12/30/15  Yes Sherren MochaShaw, Eva N, MD  Menthol, Topical Analgesic, (BENGAY COLD THERAPY EX) Apply 1 application topically as needed (muscle cramp).   Yes [provider]  amoxicillin-clavulanate (AUGMENTIN) 875-125 MG tablet Take 1 tablet by mouth 2 (two) times daily. One po bid x 7 days Patient not taking: Reported on 02/08/2020 01/27/20   Mancel BaleWentz, Elliott, MD    fluconazole (DIFLUCAN) 150 MG tablet Take after completion of antibiotic treatment started on 03/24/2019 Patient not taking: Reported on 02/08/2020 01/27/20   Mancel BaleWentz, Elliott, MD    No Known Allergies  Patient Active Problem List   Diagnosis Date Noted  . History of asthma 09/30/2017  . Tobacco use disorder 04/13/2017  . Obesity (BMI 30-39.9) 09/13/2014    Past Medical History:  Diagnosis Date  . Allergy   . Asthma   . Seasonal allergies     Past Surgical History:  Procedure Laterality Date  . DENTAL SURGERY      Social History   Socioeconomic History  . Marital status: Single    Spouse name: Not on file  . Number of children: Not on file  . Years of education: Not on file  . Highest education level: Not on file  Occupational History  . Not on file  Tobacco Use  . Smoking status: Current Every Day Smoker    Packs/day: 0.50    Years: 4.00    Pack years: 2.00    Types: Cigarettes  . Smokeless tobacco: Never Used  Vaping Use  . Vaping Use: Never used  Substance and Sexual Activity  . Alcohol use: Yes    Alcohol/week: 4.0 standard drinks    Types: 2 Glasses of wine, 2 Standard drinks or equivalent per week  . Drug use: No  . Sexual activity: Yes    Birth control/protection: Implant  Other Topics Concern  . Not on file  Social  History Narrative  . Not on file   Social Determinants of Health   Financial Resource Strain:   . Difficulty of Paying Living Expenses: Not on file  Food Insecurity:   . Worried About Programme researcher, broadcasting/film/video in the Last Year: Not on file  . Ran Out of Food in the Last Year: Not on file  Transportation Needs:   . Lack of Transportation (Medical): Not on file  . Lack of Transportation (Non-Medical): Not on file  Physical Activity:   . Days of Exercise per Week: Not on file  . Minutes of Exercise per Session: Not on file  Stress:   . Feeling of Stress : Not on file  Social Connections:   . Frequency of Communication with Friends and  Family: Not on file  . Frequency of Social Gatherings with Friends and Family: Not on file  . Attends Religious Services: Not on file  . Active Member of Clubs or Organizations: Not on file  . Attends Banker Meetings: Not on file  . Marital Status: Not on file  Intimate Partner Violence:   . Fear of Current or Ex-Partner: Not on file  . Emotionally Abused: Not on file  . Physically Abused: Not on file  . Sexually Abused: Not on file    Family History  Problem Relation Age of Onset  . Cancer Father        prostate   . Liver disease Father   . Liver disease Maternal Grandfather      Review of Systems  Constitutional: Negative.  Negative for chills and fever.  HENT: Negative.  Negative for congestion and sore throat.   Respiratory: Negative.  Negative for cough and shortness of breath.   Cardiovascular: Negative.  Negative for chest pain and palpitations.  Gastrointestinal: Positive for abdominal pain (Mostly left lower abdomen).  Genitourinary: Negative.  Negative for dysuria and hematuria.  Musculoskeletal: Negative.   Skin: Negative.  Negative for rash.  Neurological: Negative for dizziness and headaches.  All other systems reviewed and are negative.     Today's Vitals   02/08/20 1116  BP: 113/76  Pulse: 90  Resp: 16  Temp: 97.9 F (36.6 C)  TempSrc: Temporal  SpO2: 100%  Weight: 226 lb (102.5 kg)  Height: 5' 2.5" (1.588 m)   Body mass index is 40.68 kg/m.  Physical Exam Vitals reviewed.  Constitutional:      Appearance: She is well-developed. She is obese.  HENT:     Head: Normocephalic.  Eyes:     Extraocular Movements: Extraocular movements intact.     Conjunctiva/sclera: Conjunctivae normal.     Pupils: Pupils are equal, round, and reactive to light.  Cardiovascular:     Rate and Rhythm: Normal rate and regular rhythm.     Pulses: Normal pulses.     Heart sounds: Normal heart sounds.  Pulmonary:     Effort: Pulmonary effort is  normal.     Breath sounds: Normal breath sounds.  Abdominal:     General: Bowel sounds are normal. There is no distension.     Palpations: Abdomen is soft. There is no mass.     Tenderness: There is abdominal tenderness (Mild tenderness to deep palpation on left lower quadrant). There is no right CVA tenderness, left CVA tenderness, guarding or rebound.  Musculoskeletal:        General: Normal range of motion.     Cervical back: Normal range of motion and neck supple.  Skin:  General: Skin is warm and dry.     Capillary Refill: Capillary refill takes less than 2 seconds.  Neurological:     General: No focal deficit present.     Mental Status: She is alert and oriented to person, place, and time.  Psychiatric:        Mood and Affect: Mood normal.        Behavior: Behavior normal.    Results for orders placed or performed in visit on 02/08/20 (from the past 24 hour(s))  POCT urinalysis dipstick     Status: Abnormal   Collection Time: 02/08/20 12:02 PM  Result Value Ref Range   Color, UA yellow yellow   Clarity, UA clear clear   Glucose, UA negative negative mg/dL   Bilirubin, UA negative negative   Ketones, POC UA trace (5) (A) negative mg/dL   Spec Grav, UA 3.335 4.562 - 1.025   Blood, UA negative negative   pH, UA 5.0 5.0 - 8.0   Protein Ur, POC negative negative mg/dL   Urobilinogen, UA 0.2 0.2 or 1.0 E.U./dL   Nitrite, UA Negative Negative   Leukocytes, UA Trace (A) Negative  POCT Microscopic Urinalysis ([facility])     Status: Abnormal   Collection Time: 02/08/20 12:06 PM  Result Value Ref Range   WBC,UR,HPF,POC Few (A) None WBC/hpf   RBC,UR,HPF,POC None None RBC/hpf   Bacteria Too numerous to count  None, Too numerous to count   Mucus Present (A) Absent   Epithelial Cells, UR Per Microscopy Few (A) None, Too numerous to count cells/hpf     ASSESSMENT & PLAN: Temeka was seen today for abdominal pain.  Diagnoses and all orders for this visit:  Lower abdominal pain -      POCT Microscopic Urinalysis ([facility]) -     POCT urinalysis dipstick -     CBC with Differential/Platelet -     Comprehensive metabolic panel -     Urine Culture  Other orders -     fluconazole (DIFLUCAN) 150 MG tablet; Take 1 tablet (150 mg total) by mouth once for 1 dose.    Patient Instructions       If you have lab work done today you will be contacted with your lab results within the next 2 weeks.  If you have not heard from [place] then please contact us. The fastest way to get your results is to register for My Chart.   IF you received an x-ray today, you will receive an invoice from [facility]. Please contact [facility] at [phone] with questions or concerns regarding your invoice.   IF you received labwork today, you will receive an invoice from [facility]. Please contact [facility] at [phone] with questions or concerns regarding your invoice.   Our billing staff will not be able to assist you with questions regarding bills from these companies.  You will be contacted with the lab results as soon as they are available. The fastest way to get your results is to activate your My Chart account. Instructions are located on the last page of this paperwork. If you have not heard from [place] regarding the results in 2 weeks, please contact this office.     Abdominal Pain, Adult Many things can cause belly (abdominal) pain. Most times, belly pain is not dangerous. Many cases of belly pain can be watched and treated at home. Sometimes, though, belly pain is serious. Your doctor will try to find the cause of your belly pain. Follow these instructions  at home:  Medicines  Take over-the-counter and prescription medicines only as told by your doctor.  Do not take medicines that help you poop (laxatives) unless told by your doctor. General instructions  Watch your belly pain for any changes.  Drink enough fluid to keep your pee (urine) pale yellow.  Keep all  follow-up visits as told by your doctor. This is important. Contact a doctor if:  Your belly pain changes or gets worse.  You are not hungry, or you lose weight without trying.  You are having trouble pooping (constipated) or have watery poop (diarrhea) for more than 2-3 days.  You have pain when you pee or poop.  Your belly pain wakes you up at night.  Your pain gets worse with meals, after eating, or with certain foods.  You are vomiting and cannot keep anything down.  You have a fever.  You have blood in your pee. Get help right away if:  Your pain does not go away as soon as your doctor says it should.  You cannot stop vomiting.  Your pain is only in areas of your belly, such as the right side or the left lower part of the belly.  You have bloody or black poop, or poop that looks like tar.  You have very bad pain, cramping, or bloating in your belly.  You have signs of not having enough fluid or water in your body (dehydration), such as: ? Dark pee, very little pee, or no pee. ? Cracked lips. ? Dry mouth. ? Sunken eyes. ? Sleepiness. ? Weakness.  You have trouble breathing or chest pain. Summary  Many cases of belly pain can be watched and treated at home.  Watch your belly pain for any changes.  Take over-the-counter and prescription medicines only as told by your doctor.  Contact a doctor if your belly pain changes or gets worse.  Get help right away if you have very bad pain, cramping, or bloating in your belly. This information is not intended to replace advice given to you by your health care provider. Make sure you discuss any questions you have with your health care provider. Document Revised: 07/06/2018 Document Reviewed: 07/06/2018 Elsevier Patient Education  2020 Elsevier Inc.      Edwina Barth, MD [facility]

## 2020-02-09 LAB — CBC WITH DIFFERENTIAL/PLATELET
Basophils Absolute: 0 10*3/uL (ref 0.0–0.2)
Basos: 0 %
EOS (ABSOLUTE): 0.2 10*3/uL (ref 0.0–0.4)
Eos: 3 %
Hematocrit: 37 % (ref 34.0–46.6)
Hemoglobin: 12.4 g/dL (ref 11.1–15.9)
Immature Grans (Abs): 0 10*3/uL (ref 0.0–0.1)
Immature Granulocytes: 0 %
Lymphocytes Absolute: 3.1 10*3/uL (ref 0.7–3.1)
Lymphs: 32 %
MCH: 27 pg (ref 26.6–33.0)
MCHC: 33.5 g/dL (ref 31.5–35.7)
MCV: 80 fL (ref 79–97)
Monocytes Absolute: 0.6 10*3/uL (ref 0.1–0.9)
Monocytes: 6 %
Neutrophils Absolute: 5.6 10*3/uL (ref 1.4–7.0)
Neutrophils: 59 %
Platelets: 403 10*3/uL (ref 150–450)
RBC: 4.6 x10E6/uL (ref 3.77–5.28)
RDW: 14.8 % (ref 11.7–15.4)
WBC: 9.6 10*3/uL (ref 3.4–10.8)

## 2020-02-09 LAB — URINE CULTURE

## 2020-02-09 LAB — COMPREHENSIVE METABOLIC PANEL
ALT: 10 IU/L (ref 0–32)
AST: 12 IU/L (ref 0–40)
Albumin/Globulin Ratio: 1.5 (ref 1.2–2.2)
Albumin: 4.4 g/dL (ref 3.8–4.8)
Alkaline Phosphatase: 78 IU/L (ref 44–121)
BUN/Creatinine Ratio: 12 (ref 9–23)
BUN: 9 mg/dL (ref 6–20)
Bilirubin Total: 0.2 mg/dL (ref 0.0–1.2)
CO2: 19 mmol/L — ABNORMAL LOW (ref 20–29)
Calcium: 9.7 mg/dL (ref 8.7–10.2)
Chloride: 103 mmol/L (ref 96–106)
Creatinine, Ser: 0.73 mg/dL (ref 0.57–1.00)
GFR calc Af Amer: 126 mL/min/{1.73_m2} (ref >59)
GFR calc non Af Amer: 109 mL/min/{1.73_m2} (ref >59)
Globulin, Total: 3 g/dL (ref 1.5–4.5)
Glucose: 83 mg/dL (ref 65–99)
Potassium: 4.3 mmol/L (ref 3.5–5.2)
Sodium: 138 mmol/L (ref 134–144)
Total Protein: 7.4 g/dL (ref 6.0–8.5)

## 2020-02-10 ENCOUNTER — Other Ambulatory Visit: Payer: Self-pay | Admitting: Emergency Medicine

## 2020-02-10 ENCOUNTER — Other Ambulatory Visit: Payer: Self-pay

## 2020-02-10 ENCOUNTER — Telehealth: Payer: Self-pay

## 2020-02-10 DIAGNOSIS — B379 Candidiasis, unspecified: Secondary | ICD-10-CM

## 2020-02-10 DIAGNOSIS — N3000 Acute cystitis without hematuria: Secondary | ICD-10-CM

## 2020-02-10 MED ORDER — CEFUROXIME AXETIL 500 MG PO TABS: 500.0000 mg | ORAL_TABLET | Freq: Two times a day (BID) | ORAL | 0 refills | Status: AC

## 2020-02-10 MED ORDER — FLUCONAZOLE 150 MG PO TABS: 150.0000 mg | ORAL_TABLET | Freq: Once | ORAL | 0 refills | Status: AC

## 2020-02-10 NOTE — Telephone Encounter (Signed)
Pt called to get test results, she has also asked for another rx for diflucan being that the antibiotic gives her yeast infections. Medication sent to pharmacy

## 2022-02-11 IMAGING — CT CT ABD-PELV W/ CM
2 of 4 series · 16 of 46 positions shown, 18 images · IV contrast (omnipaque)
Comparison: None.

CLINICAL DATA: Right lower quadrant abdominal pain

EXAM:
CT ABDOMEN AND PELVIS WITH CONTRAST
TECHNIQUE: Multidetector CT imaging of the abdomen and pelvis was performed
using the standard protocol following bolus administration of
intravenous contrast.
CONTRAST:  100mL OMNIPAQUE IOHEXOL 300 MG/ML  SOLN

[Series 2: axial st · axial · 0.77mm/px · z∈[-425,+15]mm · 13 of 100 slices shown, 15 images]
[im 6/100  soft-tissue]
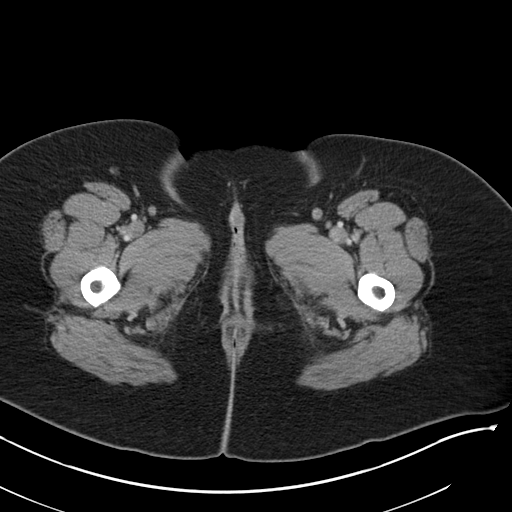
[im 6/100  bone]
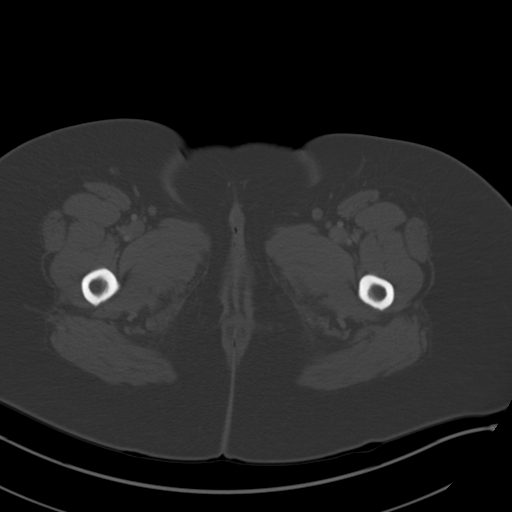
[im 12/100  soft-tissue]
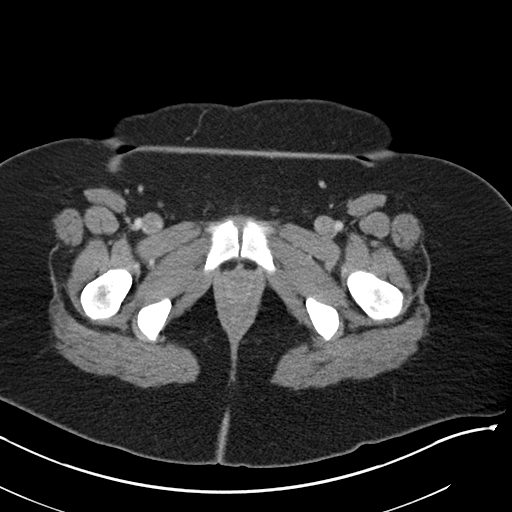
[im 24/100  soft-tissue]
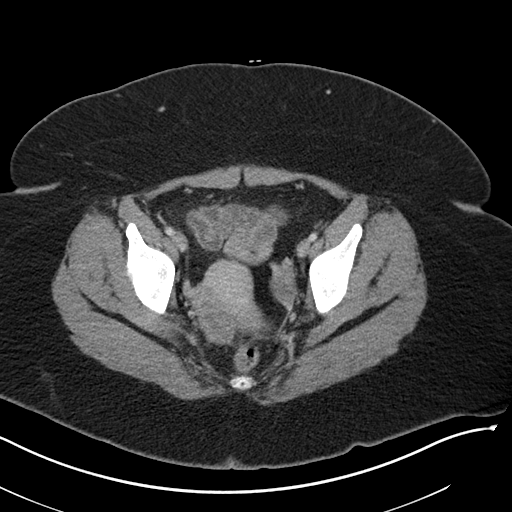
[im 30/100  soft-tissue]
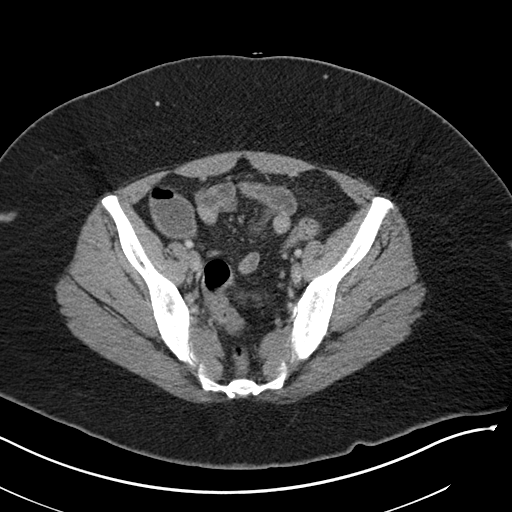
[im 35/100  soft-tissue]
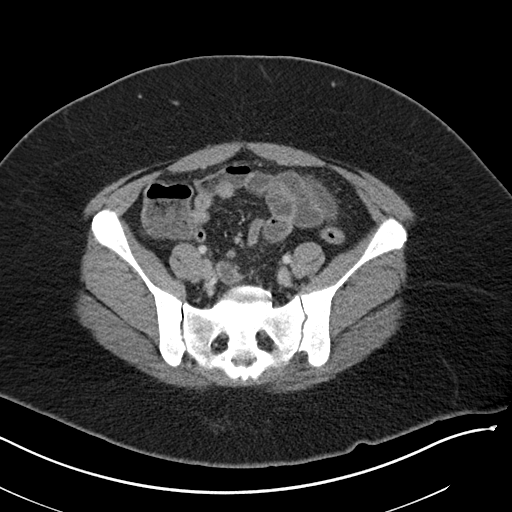
[im 41/100  soft-tissue]
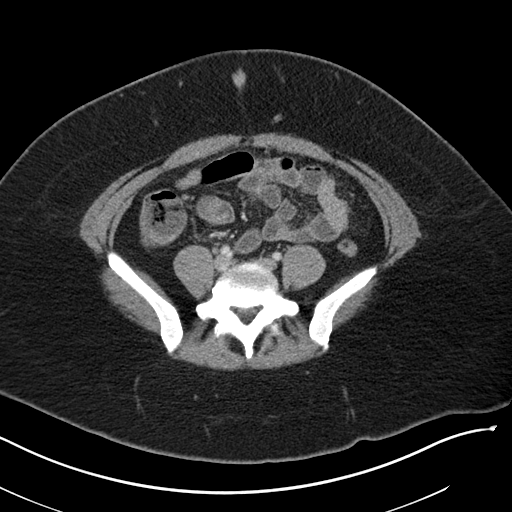
[im 53/100  soft-tissue]
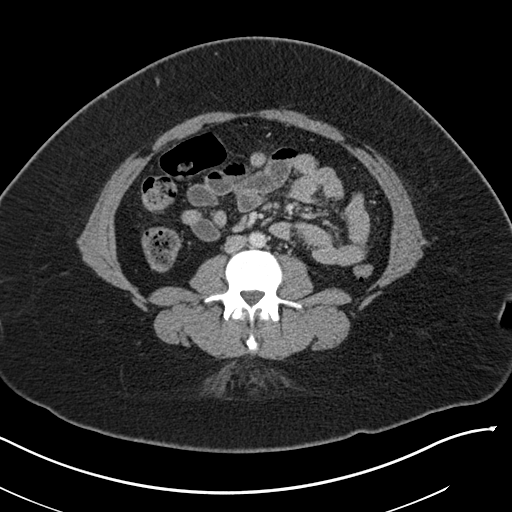
[im 59/100  soft-tissue]
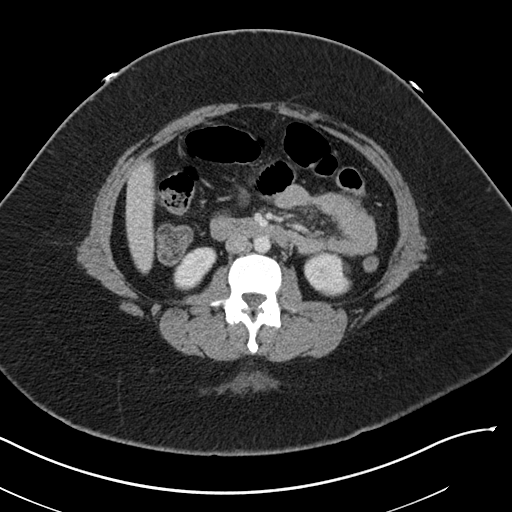
[im 65/100  soft-tissue]
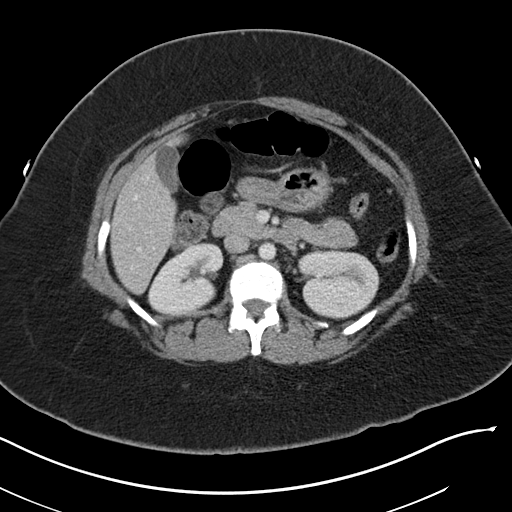
[im 65/100  bone]
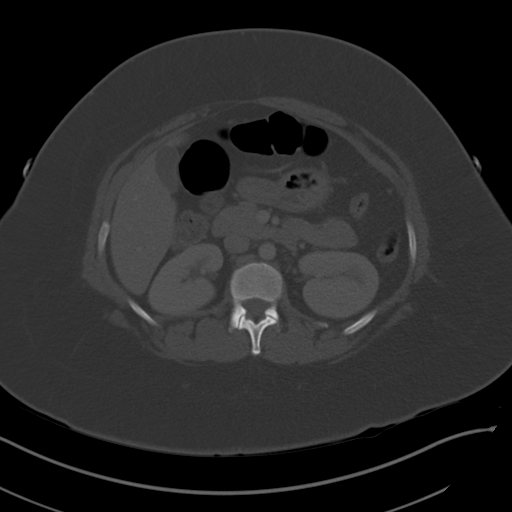
[im 70/100  soft-tissue]
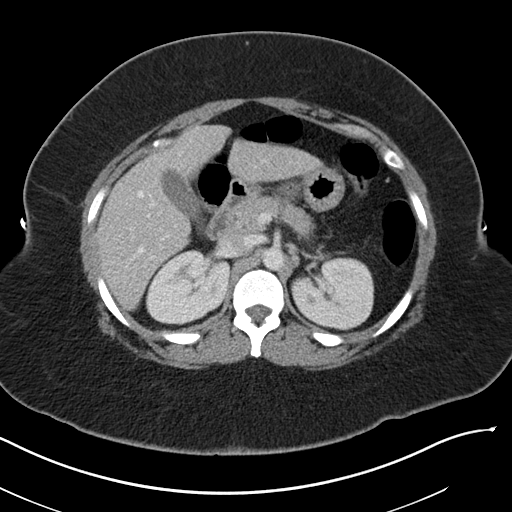
[im 76/100  soft-tissue]
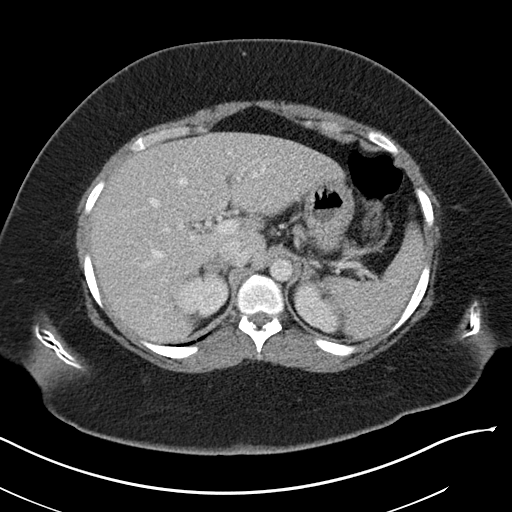
[im 88/100  soft-tissue]
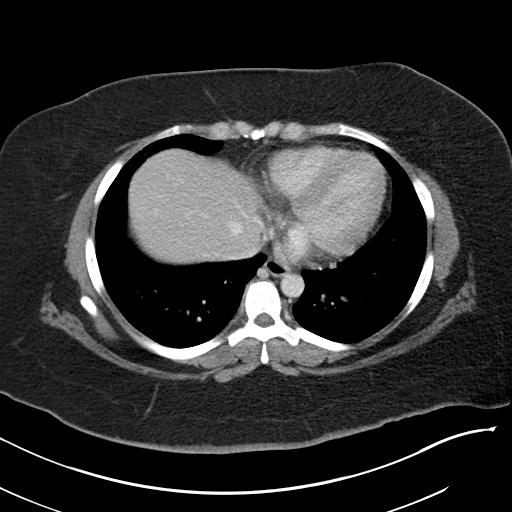
[im 94/100  soft-tissue]
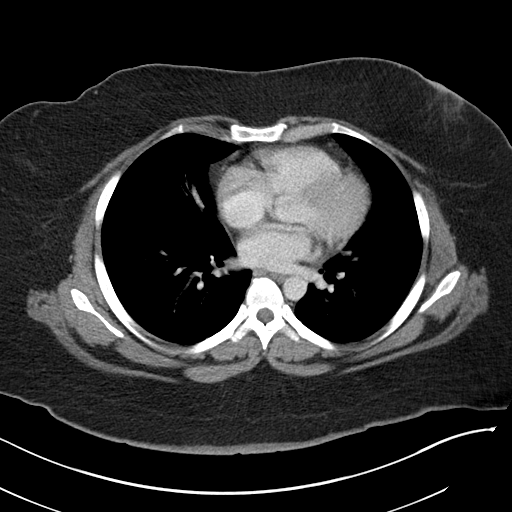

[Series 5: coronal st · coronal · 0.95mm/px · 3 of 174 slices shown]
[im 58/174  soft-tissue]
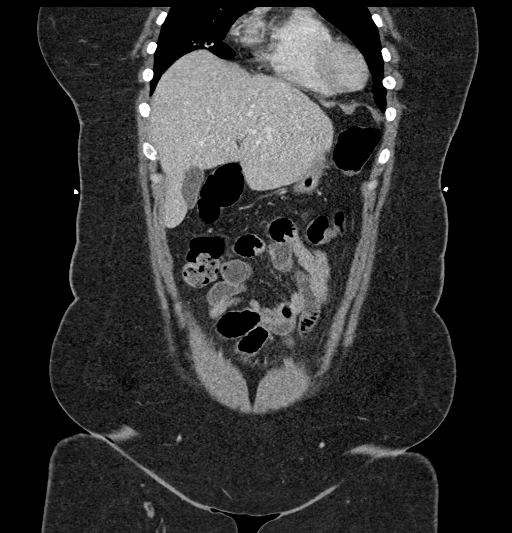
[im 77/174  soft-tissue]
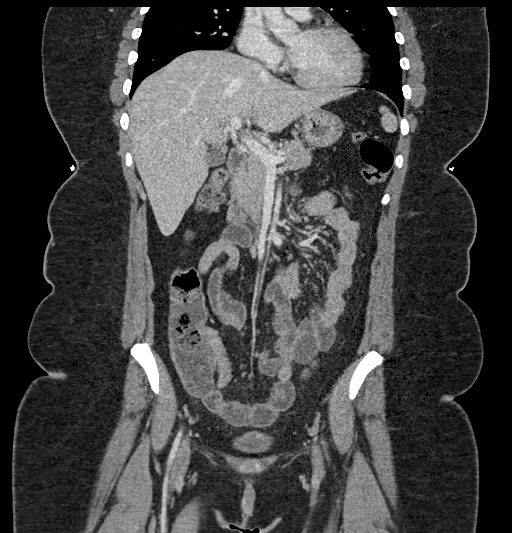
[im 97/174  soft-tissue]
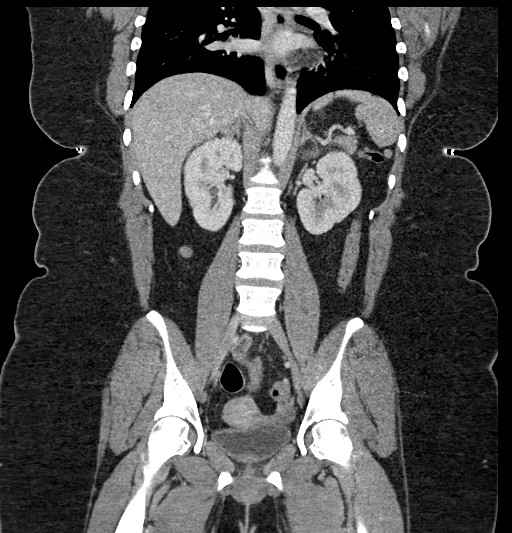

[16 of 46 positions shown; findings below may reference images not displayed]

FINDINGS: Lower chest: The visualized heart size within normal limits. No
pericardial fluid/thickening.

No hiatal hernia.

The visualized portions of the lungs are clear.

Hepatobiliary: The liver is normal in density without focal
abnormality.The main portal vein is patent. No evidence of calcified
gallstones, gallbladder wall thickening or biliary dilatation.

Pancreas: Unremarkable. No pancreatic ductal dilatation or
surrounding inflammatory changes.

Spleen: Normal in size without focal abnormality.

Adrenals/Urinary Tract: Both adrenal glands appear normal. The
kidneys and collecting system appear normal without evidence of
urinary tract calculus or hydronephrosis. Bladder is unremarkable.

Stomach/Bowel: The stomach, small bowel, and colon are normal in
appearance. No inflammatory changes, wall thickening, or obstructive
findings. Scattered diverticula are noted.The appendix is normal.

Vascular/Lymphatic: There are no enlarged mesenteric,
retroperitoneal, or pelvic lymph nodes. No significant vascular
findings are present.

Reproductive: Within the right adnexa there is a 2.5 cm low-density
lesion could represent a right ovarian cyst/dominant follicle.

Other: No evidence of abdominal wall mass or hernia.

Musculoskeletal: No acute or significant osseous findings.
IMPRESSION: Normal appearing appendix.

Right ovarian simple cyst versus dominant follicle measuring 2.5 cm.

## 2022-03-23 ENCOUNTER — Telehealth: Payer: Self-pay

## 2022-03-23 ENCOUNTER — Ambulatory Visit
Admission: EM | Admit: 2022-03-23 | Discharge: 2022-03-23 | Disposition: A | Discharge status: Home / Self Care | Payer: 59 | Attending: Internal Medicine | Admitting: Internal Medicine

## 2022-03-23 DIAGNOSIS — U071 COVID-19: Secondary | ICD-10-CM | POA: Diagnosis present

## 2022-03-23 DIAGNOSIS — J029 Acute pharyngitis, unspecified: Secondary | ICD-10-CM | POA: Diagnosis present

## 2022-03-23 LAB — POCT RAPID STREP A (OFFICE): Rapid Strep A Screen: NEGATIVE

## 2022-03-23 MED ORDER — MOLNUPIRAVIR EUA 200MG CAPSULE: 4.0000 | ORAL_CAPSULE | Freq: Two times a day (BID) | ORAL | 0 refills | Status: DC

## 2022-03-23 MED ORDER — MOLNUPIRAVIR EUA 200MG CAPSULE: 4.0000 | ORAL_CAPSULE | Freq: Two times a day (BID) | ORAL | 0 refills | Status: AC

## 2022-03-23 NOTE — ED Provider Notes (Signed)
[facility]    CSN: [id number] Arrival date & time: 03/23/22  1252      History   Chief Complaint Chief Complaint  Patient presents with   Sore Throat   Cough    HPI [name] is a 37 y.o. female.   Patient presents for further evaluation after testing positive for COVID-19.  Patient reports she took a home COVID test yesterday that was positive.  She reports sore throat, nasal congestion, cough that started yesterday as well.  Denies chest pain, shortness of breath, nausea, vomiting, diarrhea, abdominal pain.  Patient has taken Robitussin with minimal improvement in symptoms.  Patient reports history of asthma but denies that she has needed to use her albuterol inhaler since being sick.   Sore Throat  Cough   Past Medical History:  Diagnosis Date   Allergy    Asthma    Seasonal allergies     Patient Active Problem List   Diagnosis Date Noted   History of asthma 09/30/2017   Tobacco use disorder 04/13/2017   Obesity (BMI 30-39.9) 09/13/2014    Past Surgical History:  Procedure Laterality Date   DENTAL SURGERY      OB History   No obstetric history on file.      Home Medications    Prior to Admission medications   Medication Sig Start Date End Date Taking? Authorizing Provider  molnupiravir EUA (LAGEVRIO) 200 mg CAPS capsule Take 4 capsules (800 mg total) by mouth 2 (two) times daily for 5 days. 03/23/22 03/28/22 Yes Reagen Haberman, Michele Rockers, FNP  acetaminophen (TYLENOL) 325 MG tablet Take 650 mg by mouth every 6 (six) hours as needed for pain.    [provider]  albuterol (PROVENTIL HFA;VENTOLIN HFA) 108 (90 Base) MCG/ACT inhaler Inhale 2 puffs into the lungs every 6 (six) hours as needed for wheezing or shortness of breath. 09/30/17   Horald Pollen, MD  cetirizine (ZYRTEC) 10 MG chewable tablet Chew 10 mg by mouth daily as needed for allergies.     [provider]  fluticasone (FLONASE) 50 MCG/ACT nasal spray Place 2  sprays into both nostrils at bedtime. Patient taking differently: Place 2 sprays into both nostrils daily as needed for allergies.  12/30/15   Shawnee Knapp, MD  Menthol, Topical Analgesic, (BENGAY COLD THERAPY EX) Apply 1 application topically as needed (muscle cramp).    [provider]    Family History Family History  Problem Relation Age of Onset   Cancer Father        prostate    Liver disease Father    Liver disease Maternal Grandfather     Social History Social History   Tobacco Use   Smoking status: Every Day    Packs/day: 0.50    Years: 4.00    Total pack years: 2.00    Types: Cigarettes   Smokeless tobacco: Never  Vaping Use   Vaping Use: Never used  Substance Use Topics   Alcohol use: Yes    Alcohol/week: 4.0 standard drinks of alcohol    Types: 2 Glasses of wine, 2 Standard drinks or equivalent per week   Drug use: No     Allergies   Patient has no known allergies.   Review of Systems Review of Systems Per HPI  Physical Exam Triage Vital Signs ED Triage Vitals  Enc Vitals Group     BP 03/23/22 1403 114/80     Pulse Rate 03/23/22 1403 94  Resp 03/23/22 1403 18     Temp 03/23/22 1403 97.9 F (36.6 C)     Temp Source 03/23/22 1403 Oral     SpO2 03/23/22 1403 96 %     Weight --      Height --      Head Circumference --      Peak Flow --      Pain Score 03/23/22 1402 0     Pain Loc --      Pain Edu? --      Excl. in GC? --    No data found.  Updated Vital Signs BP 114/80 (BP Location: Left Arm)   Pulse 94   Temp 97.9 F (36.6 C) (Oral)   Resp 18   LMP 03/03/2022 (Exact Date)   SpO2 96%   Visual Acuity Right Eye Distance:   Left Eye Distance:   Bilateral Distance:    Right Eye Near:   Left Eye Near:    Bilateral Near:     Physical Exam Constitutional:      General: She is not in acute distress.    Appearance: Normal appearance. She is not toxic-appearing or diaphoretic.  HENT:     Head: Normocephalic and  atraumatic.     Right Ear: Tympanic membrane and ear canal normal.     Left Ear: Tympanic membrane and ear canal normal.     Nose: Congestion present.     Mouth/Throat:     Mouth: Mucous membranes are moist.     Pharynx: Posterior oropharyngeal erythema present.  Eyes:     Extraocular Movements: Extraocular movements intact.     Conjunctiva/sclera: Conjunctivae normal.     Pupils: Pupils are equal, round, and reactive to light.  Cardiovascular:     Rate and Rhythm: Normal rate and regular rhythm.     Pulses: Normal pulses.     Heart sounds: Normal heart sounds.  Pulmonary:     Effort: Pulmonary effort is normal. No respiratory distress.     Breath sounds: Normal breath sounds. No stridor. No wheezing, rhonchi or rales.  Abdominal:     General: Abdomen is flat. Bowel sounds are normal.     Palpations: Abdomen is soft.  Musculoskeletal:        General: Normal range of motion.     Cervical back: Normal range of motion.  Skin:    General: Skin is warm and dry.  Neurological:     General: No focal deficit present.     Mental Status: She is alert and oriented to person, place, and time. Mental status is at baseline.  Psychiatric:        Mood and Affect: Mood normal.        Behavior: Behavior normal.      UC Treatments / Results  Labs (all labs ordered are listed, but only abnormal results are displayed) Labs Reviewed  SARS CORONAVIRUS 2 (TAT 6-24 HRS)  CULTURE, GROUP A STREP [facility])  POCT RAPID STREP A (OFFICE)    EKG   Radiology No results found.  Procedures Procedures (including critical care time)  Medications Ordered in UC Medications - No data to display  Initial Impression / Assessment and Plan / UC Course  I have reviewed the triage vital signs and the nursing notes.  Pertinent labs & imaging results that were available during my care of the patient were reviewed by me and considered in my medical decision making (see chart for details).     Patient had  home COVID test that was positive.  Patient requesting COVID PCR for her workplace requirement.  This is pending.  Rapid strep was completed given appearance of posterior pharynx on exam.  This was negative.  Throat culture pending.  Patient requesting COVID antiviral.  Given no recent blood work and inability to do stat blood work, prescribed molnupiravir.  No concern for asthma exacerbation at this time.  Discussed supportive care and symptom management with patient.  Discussed return precautions.  Patient verbalized understanding and was agreeable with plan. Final Clinical Impressions(s) / UC Diagnoses   Final diagnoses:  COVID-19  Sore throat     Discharge Instructions      I have prescribed COVID antiviral for you to start taking today.  Please follow-up if any symptoms persist or worsen.  Rapid strep was negative.  Throat culture and COVID test pending.    ED Prescriptions     Medication Sig Dispense Auth. Provider   molnupiravir EUA (LAGEVRIO) 200 mg CAPS capsule Take 4 capsules (800 mg total) by mouth 2 (two) times daily for 5 days. 40 capsule [place], Michele Rockers, [place]      PDMP not reviewed this encounter.   Teodora Medici, [place] 03/23/22 [phone]

## 2022-03-23 NOTE — ED Triage Notes (Signed)
Pt reports sore throat, congestion and cough x 3 days. Pt states she tested her elf at home and had a positive covid test.

## 2022-03-23 NOTE — Discharge Instructions (Signed)
I have prescribed COVID antiviral for you to start taking today.  Please follow-up if any symptoms persist or worsen.  Rapid strep was negative.  Throat culture and COVID test pending.

## 2022-03-24 LAB — SARS CORONAVIRUS 2 (TAT 6-24 HRS): SARS Coronavirus 2: POSITIVE — AB

## 2022-03-26 LAB — CULTURE, GROUP A STREP (THRC)

## 2022-08-16 LAB — AMB RESULTS CONSOLE CBG: Glucose: 119

## 2022-08-16 NOTE — Progress Notes (Signed)
Pt needs PCP, given "get care now" handout and lifestyle medicine handout. No SDOH needs at this time.

## 2022-09-17 ENCOUNTER — Encounter: Payer: Self-pay | Admitting: *Deleted

## 2022-09-17 NOTE — Progress Notes (Signed)
Pt attended 08/16/2022 screening event where her v/p was 121/86 and her blood sugar was 119. At the event, the pt stated she did not have a PCP and was given PCP resources and info. She did not identify any SDOH insecurities at the event.

## 2022-11-20 ENCOUNTER — Encounter: Payer: Self-pay | Admitting: *Deleted

## 2022-11-20 NOTE — Progress Notes (Signed)
Pt attended 08/16/22 screening event where her b/p was 121/86 and her blood sugar was 119. At the event, the pt did not indicate a PCP and did not indicate any SDOH insecurities. During the initial event f/u, health equity team member unable to contact pt by phone and PCP resources given to pt at event and in follow-up letter, During the 60 day event f/u, chart review indicates pt has a future appt with Arnette Felts, FNP, at [facility] on 02/25/23 at 11:20AM. Health equity team member unable to contact pt by phone and vm left. Letter sent to pt to confirm/remind her of future appt with FNP Christell Constant.

## 2023-02-20 ENCOUNTER — Encounter: Payer: Self-pay | Admitting: *Deleted

## 2023-02-20 NOTE — Progress Notes (Signed)
Pt attended 08/16/22 screening event where her b/p was 121/86 and her blood sugar was 119. At the event, the pt shared she did not have a PCP, did have insurance, was a smoker (e cigarettes) and did not identify any SDOH needs. Pt received PCP info at the event and was mailed Get Care now and other community primary care clinic info. Chart review at the 60 day f/u chart review indicated pt had a future appt with Taylor Felts, FNP at [facility] on 02/25/23. During 6 month f/u call today, pt confirmed she did indeed intend to see Ms Taylor Constant, FNP on 02/25/23 to get established at the [facility] clinic and she still did not have any additional SDOH or other healthcare access needs. No further health equity team support indicated at this time.

## 2023-02-25 ENCOUNTER — Encounter: Payer: Self-pay | Admitting: Nurse Practitioner

## 2023-02-25 ENCOUNTER — Ambulatory Visit: Payer: 59 | Admitting: Nurse Practitioner

## 2023-02-25 VITALS — BP 120/70 | HR 71 | Temp 98.1°F | Ht 62.0 in | Wt 233.4 lb

## 2023-02-25 DIAGNOSIS — F17200 Nicotine dependence, unspecified, uncomplicated: Secondary | ICD-10-CM | POA: Diagnosis not present

## 2023-02-25 DIAGNOSIS — Z13220 Encounter for screening for lipoid disorders: Secondary | ICD-10-CM

## 2023-02-25 DIAGNOSIS — R7303 Prediabetes: Secondary | ICD-10-CM

## 2023-02-25 DIAGNOSIS — Z2821 Immunization not carried out because of patient refusal: Secondary | ICD-10-CM | POA: Insufficient documentation

## 2023-02-25 DIAGNOSIS — Z8709 Personal history of other diseases of the respiratory system: Secondary | ICD-10-CM

## 2023-02-25 DIAGNOSIS — Z7689 Persons encountering health services in other specified circumstances: Secondary | ICD-10-CM

## 2023-02-25 DIAGNOSIS — Z0000 Encounter for general adult medical examination without abnormal findings: Secondary | ICD-10-CM

## 2023-02-25 DIAGNOSIS — R21 Rash and other nonspecific skin eruption: Secondary | ICD-10-CM | POA: Diagnosis not present

## 2023-02-25 DIAGNOSIS — E66813 Obesity, class 3: Secondary | ICD-10-CM

## 2023-02-25 DIAGNOSIS — Z6841 Body Mass Index (BMI) 40.0 and over, adult: Secondary | ICD-10-CM

## 2023-02-25 DIAGNOSIS — Z113 Encounter for screening for infections with a predominantly sexual mode of transmission: Secondary | ICD-10-CM

## 2023-02-25 MED ORDER — WEGOVY 0.5 MG/0.5ML ~~LOC~~ SOAJ: 0.5000 mg | SUBCUTANEOUS | 0 refills | Status: DC

## 2023-02-25 MED ORDER — NYSTATIN 100000 UNIT/GM EX POWD: 1.0000 | Freq: Three times a day (TID) | CUTANEOUS | 0 refills | Status: DC

## 2023-02-25 NOTE — Patient Instructions (Signed)
Health Maintenance  Topic Date Due   DTaP/Tdap/Td vaccine (1 - Tdap) Never done   Pap with HPV screening  01/12/2020   COVID-19 Vaccine (3 - 2024-25 season) 03/13/2023*   Flu Shot  Completed   Hepatitis C Screening  Completed   HIV Screening  Completed   HPV Vaccine  Aged Out  *Topic was postponed. The date shown is not the original due date.

## 2023-02-25 NOTE — Progress Notes (Unsigned)
Madelaine Bhat, CMA,acting as a Neurosurgeon for Arnette Felts, FNP.,have documented all relevant documentation on the behalf of Arnette Felts, FNP,as directed by  Arnette Felts, FNP while in the presence of Arnette Felts, FNP.  Subjective:    Patient ID: [name] , female    DOB: 1986/01/02 , 37 y.o.   MRN: [id number]  Chief Complaint  Patient presents with   Establish Care    HPI  Patient presents today to establish care and HM. She was seeing Dr. Alvy Bimler with [facility] which is now closed and he retired. 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 Single. No children.    PMH - history of Asthma - has not needed an inhaler recently.  Father - prostate cancer - deceased. Mother - colon polyps, Graves disease, history of blood clots. 2 sisters - healthy.   Patient reports compliance with medication. Patient denies any chest pain, SOB, or headaches. Patient has no concerns today. Patient reports the last she saw a PCP was before covid. Patient is established with [facility]- Dr. Mindi Slicker.  She has tried Qsymia in the past. Exercises 3-5 times a week.       Past Medical History:  Diagnosis Date   Allergy    Asthma    Seasonal allergies      Family History  Problem Relation Age of Onset   Cancer Father        prostate    Liver disease Father    Liver disease Maternal Grandfather      Current Outpatient Medications:    acetaminophen (TYLENOL) 325 MG tablet, Take 650 mg by mouth every 6 (six) hours as needed for pain., Disp: , Rfl:    albuterol (PROVENTIL HFA;VENTOLIN HFA) 108 (90 Base) MCG/ACT inhaler, Inhale 2 puffs into the lungs every 6 (six) hours as needed for wheezing or shortness of breath., Disp: 1 Inhaler, Rfl: 0   cetirizine (ZYRTEC) 10 MG chewable tablet, Chew 10 mg by mouth daily as needed for allergies. , Disp: , Rfl:    fluticasone (FLONASE) 50 MCG/ACT nasal spray, Place 2 sprays into both nostrils at bedtime. (Patient taking differently:  Place 2 sprays into both nostrils daily as needed for allergies.), Disp: 16 g, Rfl: 2   metFORMIN (GLUCOPHAGE) 500 MG tablet, Take 1 tablet by mouth daily., Disp: , Rfl:    nystatin powder, Apply 1 Application topically 3 (three) times daily., Disp: 60 g, Rfl: 0   QSYMIA 11.25-69 MG CP24, Take 1 capsule by mouth daily., Disp: , Rfl:    Semaglutide-Weight Management (WEGOVY) 0.5 MG/0.5ML SOAJ, Inject 0.5 mg into the skin once a week., Disp: 2 mL, Rfl: 0   No Known Allergies    The patient states she uses none for birth control. Patient's last menstrual period was 01/29/2023.. Negative for Dysmenorrhea and Negative for Menorrhagia. Negative for: breast discharge, breast lump(s), breast pain and breast self exam. Associated symptoms include abnormal vaginal bleeding. Pertinent negatives include abnormal bleeding (hematology), anxiety, decreased libido, depression, difficulty falling sleep, dyspareunia, history of infertility, nocturia, sexual dysfunction, sleep disturbances, urinary incontinence, urinary urgency, vaginal discharge and vaginal itching. Diet regular; she is trying to drink enough water. The patient states her exercise level is minimal - will walk occasionally when nice outside. She was taking Qsymia in November but stopped after going to [place].   The patient's tobacco use is:  Social History   Tobacco Use  Smoking Status Every Day   Current packs/day: 0.50  Average packs/day: 0.5 packs/day for 4.0 years (2.0 ttl pk-yrs)   Types: Cigarettes, E-cigarettes  Smokeless Tobacco Never   She has been exposed to passive smoke. The patient's alcohol use is:  Social History   Substance and Sexual Activity  Alcohol Use Yes   Alcohol/week: 4.0 standard drinks of alcohol   Types: 2 Glasses of wine, 2 Standard drinks or equivalent per week   Additional information: Last pap done with Dr. Mindi Slicker, next one scheduled for ***.    Review of Systems  Constitutional: Negative.   HENT:  Negative.    Eyes: Negative.   Respiratory: Negative.    Cardiovascular: Negative.   Gastrointestinal: Negative.   Endocrine: Negative.   Genitourinary: Negative.   Musculoskeletal: Negative.   Skin: Negative.   Allergic/Immunologic: Negative.   Neurological: Negative.   Hematological: Negative.   Psychiatric/Behavioral: Negative.       Today's Vitals   02/25/23 1135  BP: 120/70  Pulse: 71  Temp: 98.1 F (36.7 C)  TempSrc: Oral  Weight: 233 lb 6.4 oz (105.9 kg)  Height: 5\' 2"  (1.575 m)  PainSc: 0-No pain   Body mass index is 42.69 kg/m.  Wt Readings from Last 3 Encounters:  02/25/23 233 lb 6.4 oz (105.9 kg)  02/08/20 226 lb (102.5 kg)  01/27/20 226 lb (102.5 kg)     Objective:  Physical Exam Constitutional:      General: She is not in acute distress.    Appearance: Normal appearance. She is well-developed. She is obese.  HENT:     Head: Normocephalic and atraumatic.     Right Ear: Hearing, tympanic membrane, ear canal and external ear normal. There is no impacted cerumen.     Left Ear: Hearing, tympanic membrane, ear canal and external ear normal. There is no impacted cerumen.     Nose:     Comments: Deferred - masked    Mouth/Throat:     Comments: Deferred - masked Eyes:     General: Lids are normal.     Extraocular Movements: Extraocular movements intact.     Conjunctiva/sclera: Conjunctivae normal.     Pupils: Pupils are equal, round, and reactive to light.     Funduscopic exam:    Right eye: No papilledema.        Left eye: No papilledema.  Neck:     Thyroid: No thyroid mass.     Vascular: No carotid bruit.  Cardiovascular:     Rate and Rhythm: Normal rate and regular rhythm.     Pulses: Normal pulses.     Heart sounds: Normal heart sounds. No murmur heard. Pulmonary:     Effort: Pulmonary effort is normal. No respiratory distress.     Breath sounds: Normal breath sounds. No wheezing.  Chest:     Chest wall: No mass.  Breasts:    Tanner Score is  5.     Right: Normal. No mass or tenderness.     Left: Normal. No mass or tenderness.  Abdominal:     General: Abdomen is flat. Bowel sounds are normal. There is no distension.     Palpations: Abdomen is soft.     Tenderness: There is no abdominal tenderness.  Genitourinary:    Rectum: Guaiac result negative.  Musculoskeletal:        General: No swelling. Normal range of motion.     Cervical back: Full passive range of motion without pain, normal range of motion and neck supple.     Right lower leg:  No edema.     Left lower leg: No edema.  Lymphadenopathy:     Upper Body:     Right upper body: No supraclavicular, axillary or pectoral adenopathy.     Left upper body: No supraclavicular, axillary or pectoral adenopathy.  Skin:    General: Skin is warm and dry.     Capillary Refill: Capillary refill takes less than 2 seconds.  Neurological:     General: No focal deficit present.     Mental Status: She is alert and oriented to person, place, and time.     Cranial Nerves: No cranial nerve deficit.     Sensory: No sensory deficit.  Psychiatric:        Mood and Affect: Mood normal.        Behavior: Behavior normal.        Thought Content: Thought content normal.        Judgment: Judgment normal.         Assessment And Plan:     Establishing care with new doctor, encounter for  Encounter for annual health examination -     CBC with Differential/Platelet -     CMP14+EGFR  Tobacco use disorder  History of asthma  COVID-19 vaccination declined  Screening for STDs (sexually transmitted diseases) -     NuSwab Vaginitis Plus (VG+) -     HSV 1 and 2 Ab, IgG -     RPR -     HIV Antibody (routine testing w rflx)  Prediabetes -     Hemoglobin A1c  Class 3 severe obesity due to excess calories without serious comorbidity with body mass index (BMI) of 40.0 to 44.9 in adult [facility]) -     UJWJXB; Inject 0.5 mg into the skin once a week.  Dispense: 2 mL; Refill: 0  Encounter for  screening for lipid disorder -     Lipid panel  Rash and nonspecific skin eruption -     Nystatin; Apply 1 Application topically 3 (three) times daily.  Dispense: 60 g; Refill: 0     Return for 1 year physical. Patient was given opportunity to ask questions. Patient verbalized understanding of the plan and was able to repeat key elements of the plan. All questions were answered to their satisfaction.   Arnette Felts, FNP  I, Arnette Felts, FNP, have reviewed all documentation for this visit. The documentation on 02/25/23 for the exam, diagnosis, procedures, and orders are all accurate and complete.

## 2023-02-26 LAB — CMP14+EGFR
ALT: 19 [IU]/L (ref 0–32)
AST: 16 [IU]/L (ref 0–40)
Albumin: 4.4 g/dL (ref 3.9–4.9)
Alkaline Phosphatase: 72 [IU]/L (ref 44–121)
BUN/Creatinine Ratio: 15 (ref 9–23)
BUN: 9 mg/dL (ref 6–20)
Bilirubin Total: <0.2 mg/dL (ref 0.0–1.2)
CO2: 23 mmol/L (ref 20–29)
Calcium: 9.6 mg/dL (ref 8.7–10.2)
Chloride: 101 mmol/L (ref 96–106)
Creatinine, Ser: 0.6 mg/dL (ref 0.57–1.00)
Globulin, Total: 3 g/dL (ref 1.5–4.5)
Glucose: 85 mg/dL (ref 70–99)
Potassium: 4.2 mmol/L (ref 3.5–5.2)
Sodium: 137 mmol/L (ref 134–144)
Total Protein: 7.4 g/dL (ref 6.0–8.5)
eGFR: 120 mL/min/{1.73_m2} (ref >59)

## 2023-02-26 LAB — CBC WITH DIFFERENTIAL/PLATELET
Basophils Absolute: 0 10*3/uL (ref 0.0–0.2)
Basos: 0 %
EOS (ABSOLUTE): 0.3 10*3/uL (ref 0.0–0.4)
Eos: 3 %
Hematocrit: 34.8 % (ref 34.0–46.6)
Hemoglobin: 11.2 g/dL (ref 11.1–15.9)
Immature Grans (Abs): 0 10*3/uL (ref 0.0–0.1)
Immature Granulocytes: 0 %
Lymphocytes Absolute: 2.6 10*3/uL (ref 0.7–3.1)
Lymphs: 36 %
MCH: 24.6 pg — ABNORMAL LOW (ref 26.6–33.0)
MCHC: 32.2 g/dL (ref 31.5–35.7)
MCV: 76 fL — ABNORMAL LOW (ref 79–97)
Monocytes Absolute: 0.5 10*3/uL (ref 0.1–0.9)
Monocytes: 7 %
Neutrophils Absolute: 3.9 10*3/uL (ref 1.4–7.0)
Neutrophils: 54 %
Platelets: 378 10*3/uL (ref 150–450)
RBC: 4.56 x10E6/uL (ref 3.77–5.28)
RDW: 16.4 % — ABNORMAL HIGH (ref 11.7–15.4)
WBC: 7.4 10*3/uL (ref 3.4–10.8)

## 2023-02-26 LAB — LIPID PANEL
Chol/HDL Ratio: 4 {ratio} (ref 0.0–4.4)
Cholesterol, Total: 198 mg/dL (ref 100–199)
HDL: 50 mg/dL (ref >39)
LDL Chol Calc (NIH): 124 mg/dL — ABNORMAL HIGH (ref 0–99)
Triglycerides: 134 mg/dL (ref 0–149)
VLDL Cholesterol Cal: 24 mg/dL (ref 5–40)

## 2023-02-26 LAB — HEMOGLOBIN A1C
Est. average glucose Bld gHb Est-mCnc: 123 mg/dL
Hgb A1c MFr Bld: 5.9 % — ABNORMAL HIGH (ref 4.8–5.6)

## 2023-02-26 LAB — HSV 1 AND 2 AB, IGG
HSV 1 Glycoprotein G Ab, IgG: NONREACTIVE
HSV 2 IgG, Type Spec: REACTIVE — AB

## 2023-02-26 LAB — HIV ANTIBODY (ROUTINE TESTING W REFLEX): HIV Screen 4th Generation wRfx: NONREACTIVE

## 2023-02-26 LAB — RPR: RPR Ser Ql: NONREACTIVE

## 2023-02-27 LAB — NUSWAB VAGINITIS PLUS (VG+)
Candida albicans, NAA: NEGATIVE
Candida glabrata, NAA: NEGATIVE
Chlamydia trachomatis, NAA: NEGATIVE
Neisseria gonorrhoeae, NAA: NEGATIVE
Trich vag by NAA: NEGATIVE

## 2023-03-11 DIAGNOSIS — R21 Rash and other nonspecific skin eruption: Secondary | ICD-10-CM | POA: Insufficient documentation

## 2023-03-11 NOTE — Assessment & Plan Note (Signed)

## 2023-03-11 NOTE — Assessment & Plan Note (Signed)
Will check HgbA1c, this is managed with metformin

## 2023-03-11 NOTE — Assessment & Plan Note (Signed)
 Smoking cessation instruction/counseling given:  counseled patient on the dangers of tobacco use, advised patient to stop smoking, and reviewed strategies to maximize success

## 2023-03-11 NOTE — Assessment & Plan Note (Signed)
Erythema underneath breast, will treat with nystatin powder.

## 2023-03-11 NOTE — Assessment & Plan Note (Signed)
 Behavior modifications discussed and diet history reviewed.   Pt will continue to exercise regularly and modify diet with low GI, plant based foods and decrease intake of processed foods.  Recommend intake of daily multivitamin, Vitamin D, and calcium.  Recommend monthly self breast exams for preventive screenings, as well as recommend immunizations that include influenza, TDAP

## 2023-03-11 NOTE — Assessment & Plan Note (Signed)
 Patient is here to establish care. Went over patient medical, family, social and surgical history. Reviewed with patient their medications and any allergies  Reviewed with patient their sexual orientation, drug/tobacco and alcohol use Dicussed any new concerns with patient  HM done this visit Educated patient about the importance of annual screenings and immunizations.  Advised patient to eat a healthy diet along with exercise for atleast 30-45 min atleast 4-5 days of the week.

## 2023-03-11 NOTE — Assessment & Plan Note (Signed)
 She is encouraged to strive for BMI less than 30 to decrease cardiac risk. Advised to aim for at least 150 minutes of exercise per week. If you have any stomach pain or difficulty swallowing call to office We will see if she can get approved for Wegovy , this may cause nausea allow time for this to improve. Also discussed risk of constipation increase water and fiber Goal to lose 1-2 lbs per week Increase your physical activity and incorporate 2 days of strength training.

## 2023-04-14 ENCOUNTER — Other Ambulatory Visit: Payer: Self-pay | Admitting: Nurse Practitioner

## 2023-04-14 MED ORDER — SEMAGLUTIDE-WEIGHT MANAGEMENT 1 MG/0.5ML ~~LOC~~ SOAJ: 1.0000 mg | SUBCUTANEOUS | 0 refills | Status: DC

## 2023-05-13 ENCOUNTER — Other Ambulatory Visit: Payer: Self-pay | Admitting: Nurse Practitioner

## 2023-05-13 MED ORDER — WEGOVY 1.7 MG/0.75ML ~~LOC~~ SOAJ: 1.7000 mg | SUBCUTANEOUS | 0 refills | Status: DC

## 2023-05-19 ENCOUNTER — Encounter: Payer: Self-pay | Admitting: Nurse Practitioner

## 2023-05-19 ENCOUNTER — Ambulatory Visit: Payer: Self-pay | Admitting: Nurse Practitioner

## 2023-05-19 VITALS — BP 118/80 | HR 92 | Temp 98.1°F | Ht 62.0 in | Wt 226.0 lb

## 2023-05-19 DIAGNOSIS — E66813 Obesity, class 3: Secondary | ICD-10-CM | POA: Diagnosis not present

## 2023-05-19 DIAGNOSIS — Z6841 Body Mass Index (BMI) 40.0 and over, adult: Secondary | ICD-10-CM | POA: Diagnosis not present

## 2023-05-19 DIAGNOSIS — R7303 Prediabetes: Secondary | ICD-10-CM | POA: Diagnosis not present

## 2023-05-19 MED ORDER — WEGOVY 2.4 MG/0.75ML ~~LOC~~ SOAJ: 2.4000 mg | SUBCUTANEOUS | 1 refills | Status: DC

## 2023-05-19 NOTE — Assessment & Plan Note (Signed)
 HgbA1c is stable. She can hold her metformin while taking the [facility]. Will recheck A1c at next visit.

## 2023-05-19 NOTE — Progress Notes (Signed)
 I,Jameka J Llittleton, CMA,acting as a Neurosurgeon for [employer], FNP.,have documented all relevant documentation on the behalf of Arnette Felts, FNP,as directed by  Arnette Felts, FNP while in the presence of Arnette Felts, FNP.  Subjective:  Patient ID: [name] , female    DOB: 11/07/1985 , 38 y.o.   MRN: [id number]  Chief Complaint  Patient presents with   Weight Check    HPI  Patient presents today for a weight check. She reports compliance with Wegovy 1.7MG . she will start that on tomorrow. Denies any nausea. She does her injection on Tuesdays. She has no specific questions or concerns. Denies headache, chest pain & sob.   She reports noticing more of a good change when she takes the [facility] & Metformin. She has decreased in her size from 2x to 1x. She is walking 15-30 minutes a day 5 days a week. She has been eating a pescatarian diet. She has cut back on her liquor intake, she does drink wine. Her weaknesses are breads and pastas. She is paying $25 for her Wegovy.   Pap smear scheduled for 06/02/23.   She adds, being allergy season is coming up. She would like to be referred to an allergist for allergy shots. She states OTC medications only work temporarily.      Past Medical History:  Diagnosis Date   Allergy    Asthma    Seasonal allergies      Family History  Problem Relation Age of Onset   Cancer Father        prostate    Liver disease Father    Liver disease Maternal Grandfather      Current Outpatient Medications:    acetaminophen (TYLENOL) 325 MG tablet, Take 650 mg by mouth every 6 (six) hours as needed for pain., Disp: , Rfl:    albuterol (PROVENTIL HFA;VENTOLIN HFA) 108 (90 Base) MCG/ACT inhaler, Inhale 2 puffs into the lungs every 6 (six) hours as needed for wheezing or shortness of breath., Disp: 1 Inhaler, Rfl: 0   cetirizine (ZYRTEC) 10 MG chewable tablet, Chew 10 mg by mouth daily as needed for allergies. , Disp: , Rfl:    fluticasone (FLONASE) 50 MCG/ACT  nasal spray, Place 2 sprays into both nostrils at bedtime. (Patient taking differently: Place 2 sprays into both nostrils daily as needed for allergies.), Disp: 16 g, Rfl: 2   metFORMIN (GLUCOPHAGE) 500 MG tablet, Take 1 tablet by mouth daily., Disp: , Rfl:    nystatin powder, Apply 1 Application topically 3 (three) times daily., Disp: 60 g, Rfl: 0   Semaglutide-Weight Management (WEGOVY) 2.4 MG/0.75ML SOAJ, Inject 2.4 mg into the skin once a week., Disp: 3 mL, Rfl: 1   No Known Allergies   Review of Systems  Constitutional: Negative.   Respiratory: Negative.    Cardiovascular: Negative.   Neurological: Negative.   Psychiatric/Behavioral: Negative.       Today's Vitals   05/19/23 1146  BP: 118/80  Pulse: 92  Temp: 98.1 F (36.7 C)  SpO2: 98%  Weight: 226 lb (102.5 kg)  Height: 5\' 2"  (1.575 m)   Body mass index is 41.34 kg/m.  Wt Readings from Last 3 Encounters:  05/19/23 226 lb (102.5 kg)  02/25/23 233 lb 6.4 oz (105.9 kg)  02/08/20 226 lb (102.5 kg)     Objective:  Physical Exam Vitals and nursing note reviewed.  Constitutional:      General: She is not in acute distress.    Appearance: Normal  appearance. She is well-developed. She is obese.  Cardiovascular:     Rate and Rhythm: Normal rate and regular rhythm.     Pulses: Normal pulses.     Heart sounds: Normal heart sounds. No murmur heard. Pulmonary:     Effort: Pulmonary effort is normal. No respiratory distress.     Breath sounds: Normal breath sounds.  Chest:     Chest wall: No tenderness.  Musculoskeletal:        General: Normal range of motion.  Skin:    General: Skin is warm and dry.     Capillary Refill: Capillary refill takes less than 2 seconds.  Neurological:     General: No focal deficit present.     Mental Status: She is alert and oriented to person, place, and time.     Cranial Nerves: No cranial nerve deficit.  Psychiatric:        Mood and Affect: Mood normal.        Behavior: Behavior  normal.        Thought Content: Thought content normal.        Judgment: Judgment normal.         Assessment And Plan:  Prediabetes Assessment & Plan: HgbA1c is stable. She can hold her metformin while taking the [facility]. Will recheck A1c at next visit.   Orders: -     Wegovy; Inject 2.4 mg into the skin once a week.  Dispense: 3 mL; Refill: 1  Class 3 severe obesity due to excess calories without serious comorbidity with body mass index (BMI) of 40.0 to 44.9 in adult [facility]) Assessment & Plan: Congratulated on her 7 lb weight loss. Will increase her wegovy to 2.4 mg once she completes her 1.7. encouraged to increase her physical activity by 15-30 minutes and to start thinking about incorporating strength training.   Orders: -     Wegovy; Inject 2.4 mg into the skin once a week.  Dispense: 3 mL; Refill: 1  She is encouraged to strive for BMI less than 30 to decrease cardiac risk. Advised to aim for at least 150 minutes of exercise per week.   Return for 2 month weight check..  Patient was given opportunity to ask questions. Patient verbalized understanding of the plan and was able to repeat key elements of the plan. All questions were answered to their satisfaction.    Jeanell Sparrow, FNP, have reviewed all documentation for this visit. The documentation on 05/19/23 for the exam, diagnosis, procedures, and orders are all accurate and complete.   IF YOU HAVE BEEN REFERRED TO A SPECIALIST, IT MAY TAKE 1-2 WEEKS TO SCHEDULE/PROCESS THE REFERRAL. IF YOU HAVE NOT HEARD FROM US/SPECIALIST IN TWO WEEKS, PLEASE GIVE [place] A CALL AT [phone].

## 2023-05-19 NOTE — Assessment & Plan Note (Signed)
 Congratulated on her 7 lb weight loss. Will increase her wegovy to 2.4 mg once she completes her 1.7. encouraged to increase her physical activity by 15-30 minutes and to start thinking about incorporating strength training.

## 2023-05-19 NOTE — Patient Instructions (Signed)

## 2023-05-22 ENCOUNTER — Ambulatory Visit: Payer: Self-pay | Admitting: Nurse Practitioner

## 2023-06-09 LAB — HM PAP SMEAR: HPV, high-risk: NEGATIVE

## 2023-06-10 ENCOUNTER — Other Ambulatory Visit: Payer: Self-pay | Admitting: Nurse Practitioner

## 2023-06-10 DIAGNOSIS — R7303 Prediabetes: Secondary | ICD-10-CM

## 2023-06-10 MED ORDER — WEGOVY 2.4 MG/0.75ML ~~LOC~~ SOAJ: 2.4000 mg | SUBCUTANEOUS | 1 refills | Status: DC

## 2023-07-21 ENCOUNTER — Encounter: Payer: Self-pay | Admitting: Nurse Practitioner

## 2023-07-21 ENCOUNTER — Ambulatory Visit: Admitting: Nurse Practitioner

## 2023-07-21 VITALS — BP 126/70 | HR 78 | Temp 98.7°F | Ht 62.0 in | Wt 223.0 lb

## 2023-07-21 DIAGNOSIS — Z6841 Body Mass Index (BMI) 40.0 and over, adult: Secondary | ICD-10-CM | POA: Diagnosis not present

## 2023-07-21 DIAGNOSIS — Z2821 Immunization not carried out because of patient refusal: Secondary | ICD-10-CM | POA: Diagnosis not present

## 2023-07-21 DIAGNOSIS — R7303 Prediabetes: Secondary | ICD-10-CM | POA: Diagnosis not present

## 2023-07-21 DIAGNOSIS — E66813 Obesity, class 3: Secondary | ICD-10-CM | POA: Diagnosis not present

## 2023-07-21 NOTE — Progress Notes (Signed)
 [place], CMA,acting as a Neurosurgeon for Taylor Epley, FNP.,have documented all relevant documentation on the behalf of Taylor Epley, FNP,as directed by  Taylor Epley, FNP while in the presence of Taylor Epley, FNP.  Subjective:  Patient ID: [name] , female    DOB: 04-21-1985 , 38 y.o.   MRN: [id number]  Chief Complaint  Patient presents with   Obesity    Patient presents today for a weight follow up, Patient reports compliance with medication. Patient denies any chest pain, SOB, or headaches. Patient has no concerns today. She is on wegovy  2.4mg .    HPI  Here for weight management  She just got back from vacation.  She has noticed she is not eating but maybe  She is walking 5 days a week. She has not started with any strength training.   She was injured at her job April 8th on right side, and was wondering about worker's comp      Past Medical History:  Diagnosis Date   Allergy    Asthma    Seasonal allergies      Family History  Problem Relation Age of Onset   Cancer Father        prostate    Liver disease Father    Liver disease Maternal Grandfather      Current Outpatient Medications:    acetaminophen  (TYLENOL ) 325 MG tablet, Take 650 mg by mouth every 6 (six) hours as needed for pain., Disp: , Rfl:    albuterol  (PROVENTIL  HFA;VENTOLIN  HFA) 108 (90 Base) MCG/ACT inhaler, Inhale 2 puffs into the lungs every 6 (six) hours as needed for wheezing or shortness of breath., Disp: 1 Inhaler, Rfl: 0   cetirizine (ZYRTEC) 10 MG chewable tablet, Chew 10 mg by mouth daily as needed for allergies. , Disp: , Rfl:    fluticasone  (FLONASE ) 50 MCG/ACT nasal spray, Place 2 sprays into both nostrils at bedtime. (Patient taking differently: Place 2 sprays into both nostrils daily as needed for allergies.), Disp: 16 g, Rfl: 2   nystatin  powder, Apply 1 Application topically 3 (three) times daily., Disp: 60 g, Rfl: 0   Semaglutide -Weight Management (WEGOVY ) 2.4 MG/0.75ML SOAJ,  Inject 2.4 mg into the skin once a week., Disp: 3 mL, Rfl: 1   No Known Allergies   Review of Systems  Constitutional: Negative.   Respiratory: Negative.    Cardiovascular: Negative.   Neurological: Negative.   Psychiatric/Behavioral: Negative.       Today's Vitals   07/21/23 1202  BP: 126/70  Pulse: 78  Temp: 98.7 F (37.1 C)  TempSrc: Oral  Weight: 223 lb (101.2 kg)  Height: 5\' 2"  (1.575 m)  PainSc: 0-No pain   Body mass index is 40.79 kg/m.  Wt Readings from Last 3 Encounters:  07/21/23 223 lb (101.2 kg)  05/19/23 226 lb (102.5 kg)  02/25/23 233 lb 6.4 oz (105.9 kg)      Objective:  Physical Exam Vitals and nursing note reviewed.  Constitutional:      General: She is not in acute distress.    Appearance: Normal appearance. She is well-developed. She is obese.  Cardiovascular:     Rate and Rhythm: Normal rate and regular rhythm.     Pulses: Normal pulses.     Heart sounds: Normal heart sounds. No murmur heard. Pulmonary:     Effort: Pulmonary effort is normal. No respiratory distress.     Breath sounds: Normal breath sounds.  Chest:     Chest wall:  No tenderness.  Musculoskeletal:        General: Normal range of motion.  Skin:    General: Skin is warm and dry.     Capillary Refill: Capillary refill takes less than 2 seconds.  Neurological:     General: No focal deficit present.     Mental Status: She is alert and oriented to person, place, and time.     Cranial Nerves: No cranial nerve deficit.  Psychiatric:        Mood and Affect: Mood normal.        Behavior: Behavior normal.        Thought Content: Thought content normal.        Judgment: Judgment normal.         Assessment And Plan:  Class 3 severe obesity due to excess calories without serious comorbidity with body mass index (BMI) of 40.0 to 44.9 in adult Assessment & Plan: Congratulated on her 3 lb weight loss. Continue Wegovy  to 2.4 mg once.  Encouraged to increase her physical activity to  at least 150 minutes and to start thinking about incorporating strength training.    COVID-19 vaccination declined Assessment & Plan: Declines covid 19 vaccine. Discussed risk of covid 85 and if she changes her mind about the vaccine to call the office. Education has been provided regarding the importance of this vaccine but patient still declined. Advised may receive this vaccine at local pharmacy or Health Dept.or vaccine clinic. Aware to provide a copy of the vaccination record if obtained from local pharmacy or Health Dept.  Encouraged to take multivitamin, vitamin d, vitamin c and zinc to increase immune system. Aware can call office if would like to have vaccine here at office. Verbalized acceptance and understanding.    Prediabetes Assessment & Plan: Stable, continue focusing on healthy diet     Return for 2 months weight check.  Patient was given opportunity to ask questions. Patient verbalized understanding of the plan and was able to repeat key elements of the plan. All questions were answered to their satisfaction.    Inge Mangle, FNP, have reviewed all documentation for this visit. The documentation on 07/21/23 for the exam, diagnosis, procedures, and orders are all accurate and complete.   IF YOU HAVE BEEN REFERRED TO A SPECIALIST, IT MAY TAKE 1-2 WEEKS TO SCHEDULE/PROCESS THE REFERRAL. IF YOU HAVE NOT HEARD FROM US /SPECIALIST IN TWO WEEKS, PLEASE GIVE US  A CALL AT [phone].

## 2023-07-31 NOTE — Assessment & Plan Note (Signed)

## 2023-07-31 NOTE — Assessment & Plan Note (Signed)
 Stable, continue focusing on healthy diet

## 2023-07-31 NOTE — Assessment & Plan Note (Signed)
 Congratulated on her 3 lb weight loss. Continue Wegovy  to 2.4 mg once.  Encouraged to increase her physical activity to at least 150 minutes and to start thinking about incorporating strength training.

## 2023-08-07 ENCOUNTER — Other Ambulatory Visit: Payer: Self-pay | Admitting: Nurse Practitioner

## 2023-08-07 DIAGNOSIS — Z6841 Body Mass Index (BMI) 40.0 and over, adult: Secondary | ICD-10-CM

## 2023-08-07 DIAGNOSIS — R7303 Prediabetes: Secondary | ICD-10-CM

## 2023-08-12 NOTE — Telephone Encounter (Signed)
 Copied from CRM [id number]. Topic: Clinical - Prescription Issue >> Aug 12, 2023 12:12 PM Antwanette L wrote: Reason for CRM: Patient is calling because [facility] is requesting a prior authorization for WEGOVY  2.4 MG/0.75ML SOAJ. [facility] sent a fax today around 12:09pm. Please have provider Abelino Able moore) to send a prior authorization to the pharmacy. The patient can be contacted by phone at [phone]  Pharmacy Detail [facility] [address], [place] - [address] AT [facility] [address] [place] [address] Phone: [phone] Fax: [phone] Hours: Not open 24 hours  PA SENT TO PLAN.

## 2023-09-23 ENCOUNTER — Ambulatory Visit: Admitting: Nurse Practitioner

## 2023-09-23 ENCOUNTER — Encounter: Payer: Self-pay | Admitting: Nurse Practitioner

## 2023-09-23 VITALS — BP 120/60 | HR 68 | Temp 98.5°F | Ht 62.0 in | Wt 216.2 lb

## 2023-09-23 DIAGNOSIS — Z2821 Immunization not carried out because of patient refusal: Secondary | ICD-10-CM

## 2023-09-23 DIAGNOSIS — F17200 Nicotine dependence, unspecified, uncomplicated: Secondary | ICD-10-CM

## 2023-09-23 DIAGNOSIS — E6609 Other obesity due to excess calories: Secondary | ICD-10-CM | POA: Diagnosis not present

## 2023-09-23 DIAGNOSIS — E66812 Obesity, class 2: Secondary | ICD-10-CM | POA: Diagnosis not present

## 2023-09-23 DIAGNOSIS — Z6839 Body mass index (BMI) 39.0-39.9, adult: Secondary | ICD-10-CM

## 2023-09-23 DIAGNOSIS — R7303 Prediabetes: Secondary | ICD-10-CM | POA: Diagnosis not present

## 2023-09-23 NOTE — Progress Notes (Signed)
 LILLETTE Kristeen JINNY Gladis, CMA,acting as a Neurosurgeon for [name], FNP.,have documented all relevant documentation on the behalf of [name], FNP,as directed by  [name], FNP while in the presence of [name], FNP.  Subjective:  Patient ID: [name] , female    DOB: 1985/06/29 , 38 y.o.   MRN: [id number]  Chief Complaint  Patient presents with   Obesity    Patient presents today for a weight follow up, Patient reports compliance with medication. Patient denies any chest pain, SOB, or headaches. Patient has no concerns today.     HPI  HPI  Discussed the use of AI scribe software for clinical note transcription with the patient, who gave verbal consent to proceed.  History of Present Illness [name] is a 38 year old female who presents for a weight check while on Wegovy .  She is currently taking Wegovy  at a dose of 2.4 mg and has experienced a weight loss of six pounds since the last visit. She engages in regular physical activity, walking for 30 minutes daily from Monday to Friday, and sometimes extending to an hour. She does not currently incorporate strength training into her routine.  Her diet consists of eating greens daily and avoiding fried foods when possible. She typically consumes chicken or shrimp salads and maintains good water intake. She is obtaining Wegovy  through her insurance with a copay of $25.  She vapes flavored cartridges, having previously smoked cigarettes. She estimates her vaping is equivalent to smoking three cigarettes a day, with usage occurring four times during work hours and more frequently at home, totaling about ten times a day.  She is due for an A1c recheck today, with the last recorded level being 5.9.   Past Medical History:  Diagnosis Date   Allergy    Asthma    Seasonal allergies      Family History  Problem Relation Age of Onset   Cancer Father        prostate    Liver disease Father    Liver disease Maternal Grandfather       Current Outpatient Medications:    Semaglutide -Weight Management (WEGOVY ) 2.4 MG/0.75ML SOAJ, ADMINISTER 2.4 MG UNDER THE SKIN 1 TIME A WEEK., Disp: 3 mL, Rfl: 1   No Known Allergies   Review of Systems  Constitutional: Negative.   Respiratory: Negative.    Cardiovascular: Negative.   Neurological: Negative.   Psychiatric/Behavioral: Negative.       Today's Vitals   09/23/23 1124  BP: 120/60  Pulse: 68  Temp: 98.5 F (36.9 C)  TempSrc: Oral  Weight: 216 lb 3.2 oz (98.1 kg)  Height: 5' 2 (1.575 m)  PainSc: 0-No pain   Body mass index is 39.54 kg/m.  Wt Readings from Last 3 Encounters:  09/23/23 216 lb 3.2 oz (98.1 kg)  07/21/23 223 lb (101.2 kg)  05/19/23 226 lb (102.5 kg)     Objective:  Physical Exam Vitals and nursing note reviewed.  Constitutional:      General: She is not in acute distress.    Appearance: Normal appearance. She is well-developed. She is obese.  Cardiovascular:     Rate and Rhythm: Normal rate and regular rhythm.     Pulses: Normal pulses.     Heart sounds: Normal heart sounds. No murmur heard. Pulmonary:     Effort: Pulmonary effort is normal. No respiratory distress.     Breath sounds: Normal breath sounds.  Chest:     Chest  wall: No tenderness.  Musculoskeletal:        General: Normal range of motion.  Skin:    General: Skin is warm and dry.     Capillary Refill: Capillary refill takes less than 2 seconds.  Neurological:     General: No focal deficit present.     Mental Status: She is alert and oriented to person, place, and time.     Cranial Nerves: No cranial nerve deficit.  Psychiatric:        Mood and Affect: Mood normal.        Behavior: Behavior normal.        Thought Content: Thought content normal.        Judgment: Judgment normal.         Assessment And Plan:  Class 2 obesity due to excess calories with body mass index (BMI) of 39.0 to 39.9 in adult, unspecified whether serious comorbidity present Assessment &  Plan: On Semaglutide  2.4 mg weekly with 6-pound weight loss. Engages in daily walking and dietary changes. Insurance covers medication with $25 copay. - Continue Semaglutide  2.4 mg weekly. - Encourage walking and dietary modifications. - Incorporate strength training. - Notify for refill to avoid insurance denial.   Prediabetes Assessment & Plan: Last A1c 5.9%. Following dietary and physical activity recommendations. - Recheck A1c today. - Continue dietary modifications and physical activity.  Orders: -     Hemoglobin A1c  Vaping nicotine  dependence, non-tobacco product Assessment & Plan: Vapes flavored cartridges, previously smoked cigarettes. Discussed health risks and encouraged cessation. - Encourage vaping cessation.   COVID-19 vaccination declined Assessment & Plan: Declines covid 19 vaccine. Discussed risk of covid 23 and if she changes her mind about the vaccine to call the office. Education has been provided regarding the importance of this vaccine but patient still declined. Advised may receive this vaccine at local pharmacy or Health Dept.or vaccine clinic. Aware to provide a copy of the vaccination record if obtained from local pharmacy or Health Dept.  Encouraged to take multivitamin, vitamin d, vitamin c and zinc to increase immune system. Aware can call office if would like to have vaccine here at office. Verbalized acceptance and understanding.      Return for 2 months weight check.  Patient was given opportunity to ask questions. Patient verbalized understanding of the plan and was able to repeat key elements of the plan. All questions were answered to their satisfaction.    LILLETTE [name], FNP, have reviewed all documentation for this visit. The documentation on 09/23/23 for the exam, diagnosis, procedures, and orders are all accurate and complete.   IF YOU HAVE BEEN REFERRED TO A SPECIALIST, IT MAY TAKE 1-2 WEEKS TO SCHEDULE/PROCESS THE REFERRAL. IF YOU HAVE  NOT HEARD FROM US /SPECIALIST IN TWO WEEKS, PLEASE GIVE US  A CALL AT [phone] X [phone].

## 2023-09-24 LAB — HEMOGLOBIN A1C
Est. average glucose Bld gHb Est-mCnc: 105 mg/dL
Hgb A1c MFr Bld: 5.3 % (ref 4.8–5.6)

## 2023-10-01 ENCOUNTER — Ambulatory Visit: Payer: Self-pay | Admitting: Nurse Practitioner

## 2023-10-01 ENCOUNTER — Other Ambulatory Visit: Payer: Self-pay | Admitting: Nurse Practitioner

## 2023-10-01 DIAGNOSIS — E66813 Obesity, class 3: Secondary | ICD-10-CM

## 2023-10-01 DIAGNOSIS — E6609 Other obesity due to excess calories: Secondary | ICD-10-CM | POA: Insufficient documentation

## 2023-10-01 DIAGNOSIS — R7303 Prediabetes: Secondary | ICD-10-CM

## 2023-10-01 MED ORDER — WEGOVY 2.4 MG/0.75ML ~~LOC~~ SOAJ: SUBCUTANEOUS | 1 refills | Status: DC

## 2023-10-01 NOTE — Assessment & Plan Note (Signed)
 On Semaglutide  2.4 mg weekly with 6-pound weight loss. Engages in daily walking and dietary changes. Insurance covers medication with $25 copay. - Continue Semaglutide  2.4 mg weekly. - Encourage walking and dietary modifications. - Incorporate strength training. - Notify for refill to avoid insurance denial.

## 2023-10-01 NOTE — Assessment & Plan Note (Signed)
 Vapes flavored cartridges, previously smoked cigarettes. Discussed health risks and encouraged cessation. - Encourage vaping cessation.

## 2023-10-01 NOTE — Telephone Encounter (Signed)
 Copied from CRM [id number]. Topic: Clinical - Medication Refill >> Oct 01, 2023 12:47 PM Carlyon D wrote: Medication: Wegovy  2.4 MG   Has the patient contacted their pharmacy? Yes (Agent: If no, request that the patient contact the pharmacy for the refill. If patient does not wish to contact the pharmacy document the reason why and proceed with request.) (Agent: If yes, when and what did the pharmacy advise?)  This is the patient's preferred pharmacy:  [id number] [address], [place] - [address] AT [facility] [address] [place] [address] Phone: [phone] Fax: [phone]    Is this the correct pharmacy for this prescription? Yes If no, delete pharmacy and type the correct one.   Has the prescription been filled recently? Yes  Is the patient out of the medication? Yes  Has the patient been seen for an appointment in the last year OR does the patient have an upcoming appointment? Yes  Can we respond through MyChart? Yes  Agent: Please be advised that Rx refills may take up to 3 business days. We ask that you follow-up with your pharmacy.

## 2023-10-01 NOTE — Assessment & Plan Note (Signed)
 Last A1c 5.9%. Following dietary and physical activity recommendations. - Recheck A1c today. - Continue dietary modifications and physical activity.

## 2023-10-01 NOTE — Assessment & Plan Note (Signed)

## 2023-11-26 ENCOUNTER — Ambulatory Visit: Admitting: Nurse Practitioner

## 2023-11-27 ENCOUNTER — Ambulatory Visit: Admitting: Nurse Practitioner

## 2023-11-27 ENCOUNTER — Encounter: Payer: Self-pay | Admitting: Nurse Practitioner

## 2023-11-27 VITALS — BP 126/70 | HR 85 | Temp 98.3°F | Ht 62.0 in | Wt 208.0 lb

## 2023-11-27 DIAGNOSIS — R7303 Prediabetes: Secondary | ICD-10-CM

## 2023-11-27 DIAGNOSIS — E66813 Obesity, class 3: Secondary | ICD-10-CM

## 2023-11-27 DIAGNOSIS — N912 Amenorrhea, unspecified: Secondary | ICD-10-CM

## 2023-11-27 DIAGNOSIS — Z6838 Body mass index (BMI) 38.0-38.9, adult: Secondary | ICD-10-CM

## 2023-11-27 DIAGNOSIS — N926 Irregular menstruation, unspecified: Secondary | ICD-10-CM | POA: Diagnosis not present

## 2023-11-27 DIAGNOSIS — E6609 Other obesity due to excess calories: Secondary | ICD-10-CM

## 2023-11-27 DIAGNOSIS — Z6841 Body Mass Index (BMI) 40.0 and over, adult: Secondary | ICD-10-CM

## 2023-11-27 DIAGNOSIS — Z2821 Immunization not carried out because of patient refusal: Secondary | ICD-10-CM

## 2023-11-27 DIAGNOSIS — E66812 Obesity, class 2: Secondary | ICD-10-CM

## 2023-11-27 MED ORDER — WEGOVY 2.4 MG/0.75ML ~~LOC~~ SOAJ
SUBCUTANEOUS | 1 refills | Status: DC
Start: 2023-11-27 — End: 2024-01-26

## 2023-11-27 NOTE — Progress Notes (Signed)
 [name], CMA,acting as a Neurosurgeon for [name], FNP.,have documented all relevant documentation on the behalf of [name], FNP,as directed by  [name], FNP while in the presence of [name], FNP.  Subjective:  Patient ID: [name] , female    DOB: Oct 03, 1985 , 38 y.o.   MRN: [id number]  Chief Complaint  Patient presents with   Obesity    Patient presents today for a weight follow up, Patient reports compliance with medication. Patient denies any chest pain, SOB, or headaches.    Amenorrhea    Patient reports she didn't have a period in August but has had one this month.      HPI  Discussed the use of AI scribe software for clinical note transcription with the patient, who gave verbal consent to proceed.  History of Present Illness [name] is a 38 year old female with PCOS who presents for a weight check.  She has been on Wegovy  2.4 mg without experiencing nausea. Since July, she has lost 8 pounds, reducing her weight from 216 pounds to 208 pounds. Her weight was 223 pounds in May and 233 pounds in December of the previous year. She attributes her weight loss to dietary changes, including incorporating more salads and greens, avoiding fried foods, and choosing healthy snacks like fruits. She has also stopped drinking alcohol, which she feels has improved her energy levels and overall well-being. She does not engage in regular exercise but walks occasionally.  She missed her period last month, which is unusual for her as she is typically very regular. She has a history of PCOS and consulted her OB-GYN, who mentioned that irregular periods might recur as she ages. She has not experienced any fatigue and feels more energetic since ceasing alcohol consumption. No trouble swallowing or other significant symptoms. Her A1c was 5.3 in July. She has not experienced any significant stress beyond normal daily life, although she mentions some family-related  stress.   Past Medical History:  Diagnosis Date   Allergy    Asthma    Seasonal allergies      Family History  Problem Relation Age of Onset   Cancer Father        prostate    Liver disease Father    Liver disease Maternal Grandfather      Current Outpatient Medications:    semaglutide -weight management (WEGOVY ) 2.4 MG/0.75ML SOAJ SQ injection, ADMINISTER 2.4 MG UNDER THE SKIN 1 TIME A WEEK., Disp: 3 mL, Rfl: 1   No Known Allergies   Review of Systems  Constitutional: Negative.   Respiratory: Negative.    Cardiovascular: Negative.   Neurological: Negative.   Psychiatric/Behavioral: Negative.       Today's Vitals   11/27/23 1205  BP: 126/70  Pulse: 85  Temp: 98.3 F (36.8 C)  TempSrc: Oral  Weight: 208 lb (94.3 kg)  Height: 5' 2 (1.575 m)  PainSc: 0-No pain   Body mass index is 38.04 kg/m.  Wt Readings from Last 3 Encounters:  11/27/23 208 lb (94.3 kg)  09/23/23 216 lb 3.2 oz (98.1 kg)  07/21/23 223 lb (101.2 kg)     Objective:  Physical Exam Vitals and nursing note reviewed.  Constitutional:      General: She is not in acute distress.    Appearance: Normal appearance. She is well-developed. She is obese.  Cardiovascular:     Rate and Rhythm: Normal rate and regular rhythm.     Pulses: Normal pulses.  Heart sounds: Normal heart sounds. No murmur heard. Pulmonary:     Effort: Pulmonary effort is normal. No respiratory distress.     Breath sounds: Normal breath sounds.  Chest:     Chest wall: No tenderness.  Musculoskeletal:        General: Normal range of motion.  Skin:    General: Skin is warm and dry.     Capillary Refill: Capillary refill takes less than 2 seconds.  Neurological:     General: No focal deficit present.     Mental Status: She is alert and oriented to person, place, and time.     Cranial Nerves: No cranial nerve deficit.  Psychiatric:        Mood and Affect: Mood normal.        Behavior: Behavior normal.        Thought  Content: Thought content normal.        Judgment: Judgment normal.      Assessment And Plan:  Class 2 obesity due to excess calories with body mass index (BMI) of 38.0 to 38.9 in adult, unspecified whether serious comorbidity present  Influenza vaccination declined  Irregular menses Assessment & Plan: She had a menses on 11/24/2023, thought to be related to her PCOS. Will check thyroid functions  Orders: -     TSH  Class 3 severe obesity due to excess calories without serious comorbidity with body mass index (BMI) of 40.0 to 44.9 in adult Assessment & Plan: Weight reduced from 233 to 208 pounds on Semaglutide  with no side effects. Dietary changes noted. Lack of exercise identified. - Continue Semaglutide  (Wegovy ) 2.4 mg weekly. - Encourage regular exercise. - Advise monitoring sugar content in non-alcoholic beverages. - Send prescription for Semaglutide  to [facility] on [address].  Orders: -     Wegovy ; ADMINISTER 2.4 MG UNDER THE SKIN 1 TIME A WEEK.  Dispense: 3 mL; Refill: 1  Prediabetes Assessment & Plan: Stable A1c.  Following dietary and physical activity recommendations. - Recheck A1c today. - Continue dietary modifications and physical activity.  Orders: -     Wegovy ; ADMINISTER 2.4 MG UNDER THE SKIN 1 TIME A WEEK.  Dispense: 3 mL; Refill: 1     Return for keep same next.  Patient was given opportunity to ask questions. Patient verbalized understanding of the plan and was able to repeat key elements of the plan. All questions were answered to their satisfaction.    Taylor [name], FNP, have reviewed all documentation for this visit. The documentation on 11/27/23 for the exam, diagnosis, procedures, and orders are all accurate and complete.   IF YOU HAVE BEEN REFERRED TO A SPECIALIST, IT MAY TAKE 1-2 WEEKS TO SCHEDULE/PROCESS THE REFERRAL. IF YOU HAVE NOT HEARD FROM US /SPECIALIST IN TWO WEEKS, PLEASE GIVE US  A CALL AT [phone].

## 2023-11-28 LAB — TSH: TSH: 1.49 u[IU]/mL (ref 0.450–4.500)

## 2023-12-04 ENCOUNTER — Ambulatory Visit: Payer: Self-pay | Admitting: Nurse Practitioner

## 2023-12-07 DIAGNOSIS — N926 Irregular menstruation, unspecified: Secondary | ICD-10-CM | POA: Insufficient documentation

## 2023-12-07 NOTE — Assessment & Plan Note (Signed)
 She had a menses on 11/24/2023, thought to be related to her PCOS. Will check thyroid functions

## 2023-12-07 NOTE — Assessment & Plan Note (Signed)
 Stable A1c.  Following dietary and physical activity recommendations. - Recheck A1c today. - Continue dietary modifications and physical activity.

## 2023-12-07 NOTE — Assessment & Plan Note (Signed)
 Weight reduced from 233 to 208 pounds on Semaglutide  with no side effects. Dietary changes noted. Lack of exercise identified. - Continue Semaglutide  (Wegovy ) 2.4 mg weekly. - Encourage regular exercise. - Advise monitoring sugar content in non-alcoholic beverages. - Send prescription for Semaglutide  to [facility] on [address].

## 2024-01-23 ENCOUNTER — Other Ambulatory Visit: Payer: Self-pay | Admitting: Nurse Practitioner

## 2024-01-23 DIAGNOSIS — E66813 Obesity, class 3: Secondary | ICD-10-CM

## 2024-01-23 DIAGNOSIS — R7303 Prediabetes: Secondary | ICD-10-CM

## 2024-01-26 ENCOUNTER — Other Ambulatory Visit: Payer: Self-pay

## 2024-01-26 DIAGNOSIS — Z6841 Body Mass Index (BMI) 40.0 and over, adult: Secondary | ICD-10-CM

## 2024-01-26 DIAGNOSIS — R7303 Prediabetes: Secondary | ICD-10-CM

## 2024-01-26 MED ORDER — WEGOVY 2.4 MG/0.75ML ~~LOC~~ SOAJ: SUBCUTANEOUS | 1 refills | Status: AC

## 2024-02-27 ENCOUNTER — Ambulatory Visit (INDEPENDENT_AMBULATORY_CARE_PROVIDER_SITE_OTHER): Admitting: Nurse Practitioner

## 2024-02-27 ENCOUNTER — Encounter: Payer: Self-pay | Admitting: Nurse Practitioner

## 2024-02-27 VITALS — BP 130/80 | HR 95 | Temp 98.6°F | Ht 62.0 in | Wt 206.2 lb

## 2024-02-27 DIAGNOSIS — E66812 Obesity, class 2: Secondary | ICD-10-CM

## 2024-02-27 DIAGNOSIS — Z13220 Encounter for screening for lipoid disorders: Secondary | ICD-10-CM

## 2024-02-27 DIAGNOSIS — R7303 Prediabetes: Secondary | ICD-10-CM | POA: Diagnosis not present

## 2024-02-27 DIAGNOSIS — Z136 Encounter for screening for cardiovascular disorders: Secondary | ICD-10-CM | POA: Diagnosis not present

## 2024-02-27 DIAGNOSIS — Z6837 Body mass index (BMI) 37.0-37.9, adult: Secondary | ICD-10-CM | POA: Diagnosis not present

## 2024-02-27 DIAGNOSIS — Z0000 Encounter for general adult medical examination without abnormal findings: Secondary | ICD-10-CM

## 2024-02-27 DIAGNOSIS — Z113 Encounter for screening for infections with a predominantly sexual mode of transmission: Secondary | ICD-10-CM

## 2024-02-27 DIAGNOSIS — Z8742 Personal history of other diseases of the female genital tract: Secondary | ICD-10-CM

## 2024-02-27 DIAGNOSIS — F17200 Nicotine dependence, unspecified, uncomplicated: Secondary | ICD-10-CM | POA: Diagnosis not present

## 2024-02-27 DIAGNOSIS — Z0001 Encounter for general adult medical examination with abnormal findings: Secondary | ICD-10-CM | POA: Diagnosis not present

## 2024-02-27 DIAGNOSIS — Z114 Encounter for screening for human immunodeficiency virus [HIV]: Secondary | ICD-10-CM

## 2024-02-27 MED ORDER — ZEPBOUND 5 MG/0.5ML ~~LOC~~ SOAJ: 5.0000 mg | SUBCUTANEOUS | 1 refills | Status: AC

## 2024-02-27 NOTE — Progress Notes (Signed)
 [name], CMA,acting as a neurosurgeon for Taylor Ada, FNP.,have documented all relevant documentation on the behalf of Taylor Ada, FNP,as directed by  Taylor Ada, FNP while in the presence of Taylor Ada, FNP.  Subjective:    Patient ID: [name] , female    DOB: Sep 27, 1985 , 38 y.o.   MRN: [id number]  Chief Complaint  Patient presents with   Annual Exam    Patient presents today for HM, Patient reports compliance with medication. Patient denies any chest pain, SOB, or headaches. Patient has no concerns today.     She is followed by Dr. Delana, last seen in March.     Discussed the use of AI scribe software for clinical note transcription with the patient, who gave verbal consent to proceed.  History of Present Illness [name] is a 38 year old female who presents for an annual physical exam.  She has not seen any other healthcare providers since her last visit approximately six months ago, except for a visit to her gynecologist in March. She is not currently using any form of birth control, and her last menstrual cycle began on December 3rd.  Her dietary habits include intentional consumption of green vegetables and salads with meals, while avoiding fried foods. She enjoys bread and sandwiches but is attempting to improve her diet by increasing water intake and choosing fruits over chips. She does not engage in regular exercise due to a busy schedule but is open to incorporating more physical activity into her routine.  She received a flu shot on September 27th and has documentation available for her medical chart.  She wants to quit vaping, which contains nicotine , and is considering using a nicotine  patch to aid in cessation. She has a history of smoking cigarettes and Black and Mild cigars before switching to vaping.  She is currently on Wegovy  at a dose of 2.4 mg and is considering switching to Zepbound  at a 5 mg dose due to reaching the maximum dose of Wegovy .  She has been using Wegovy  since December 17th of the previous year and has lost approximately 26 pounds, with her weight decreasing from 233 pounds to 206 pounds. Her BMI has also decreased.  She experiences abdominal pain around her menstrual cycle, which she manages with ibuprofen or Aleve. She has been diagnosed with PCOS and describes being 'miserable' during her menstrual cycle and ovulation.  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 She is interested in STD screening, including RPR, HIV, and swabs for gonorrhea, chlamydia, trichomonas, BV, and yeast infections.  Past Medical History:  Diagnosis Date   Allergy    Asthma    Seasonal allergies      Family History  Problem Relation Age of Onset   Cancer Father        prostate    Liver disease Father    Liver disease Maternal Grandfather     Current Medications[1]   Allergies[2]    The patient states she uses none for birth control. Patient's last menstrual period was 02/11/2024.   Negative for Dysmenorrhea and Negative for Menorrhagia. Negative for: breast discharge, breast lump(s), breast pain and breast self exam. Associated symptoms include abnormal vaginal bleeding. Pertinent negatives include abnormal bleeding (hematology), anxiety, decreased libido, depression, difficulty falling sleep, dyspareunia, history of infertility, nocturia, sexual dysfunction, sleep disturbances, urinary incontinence, urinary urgency, vaginal discharge and vaginal itching. Diet regular; she is intentional tries to  eat a salad with her meals. She has challenges with eating bread and pasta. Her water intake is good. Fruits instead of chips. The patient states her exercise level is none, she is constantly.   The patient's tobacco use is:  Social History   Tobacco Use  Smoking Status Former   Current packs/day: 0.00   Average packs/day: 0.5 packs/day for 4.0 years (2.0 ttl pk-yrs)   Types:  Cigarettes, E-cigarettes   Quit date: 01/08/2019   Years since quitting: 5.1  Smokeless Tobacco Never  . She has been exposed to passive smoke. The patient's alcohol use is:  Social History   Substance and Sexual Activity  Alcohol Use Yes   Alcohol/week: 4.0 standard drinks of alcohol   Types: 2 Glasses of wine, 2 Standard drinks or equivalent per week   Review of Systems  Constitutional: Negative.   HENT: Negative.    Eyes: Negative.   Respiratory: Negative.    Cardiovascular: Negative.   Gastrointestinal: Negative.   Endocrine: Negative.   Genitourinary: Negative.   Musculoskeletal: Negative.   Skin: Negative.   Allergic/Immunologic: Negative.   Neurological: Negative.   Hematological: Negative.   Psychiatric/Behavioral: Negative.       Today's Vitals   02/27/24 1012  BP: 130/80  Pulse: 95  Temp: 98.6 F (37 C)  TempSrc: Oral  Weight: 206 lb 3.2 oz (93.5 kg)  Height: 5' 2 (1.575 m)  PainSc: 0-No pain   Body mass index is 37.71 kg/m.  Wt Readings from Last 3 Encounters:  02/27/24 206 lb 3.2 oz (93.5 kg)  11/27/23 208 lb (94.3 kg)  09/23/23 216 lb 3.2 oz (98.1 kg)     Objective:  Physical Exam Vitals and nursing note reviewed.  Constitutional:      General: She is not in acute distress.    Appearance: Normal appearance. She is well-developed. She is obese.  HENT:     Head: Normocephalic and atraumatic.     Right Ear: Hearing, tympanic membrane, ear canal and external ear normal. There is no impacted cerumen.     Left Ear: Hearing, tympanic membrane, ear canal and external ear normal. There is no impacted cerumen.     Nose: Nose normal.     Mouth/Throat:     Mouth: Mucous membranes are moist.  Eyes:     General: Lids are normal.     Extraocular Movements: Extraocular movements intact.     Conjunctiva/sclera: Conjunctivae normal.     Pupils: Pupils are equal, round, and reactive to light.     Funduscopic exam:    Right eye: No papilledema.         Left eye: No papilledema.  Neck:     Thyroid: No thyroid mass.     Vascular: No carotid bruit.  Cardiovascular:     Rate and Rhythm: Normal rate and regular rhythm.     Pulses: Normal pulses.     Heart sounds: Normal heart sounds. No murmur heard. Pulmonary:     Effort: Pulmonary effort is normal. No respiratory distress.     Breath sounds: Normal breath sounds. No wheezing.  Chest:     Chest wall: No mass.  Breasts:    Tanner Score is 5.     Right: Normal. No mass or tenderness.     Left: Normal. No mass or tenderness.  Abdominal:     General: Abdomen is flat. Bowel sounds are normal. There is no distension.     Palpations: Abdomen is soft.  Tenderness: There is no abdominal tenderness.  Genitourinary:    Rectum: Guaiac result negative.  Musculoskeletal:        General: No swelling. Normal range of motion.     Cervical back: Full passive range of motion without pain, normal range of motion and neck supple.     Right lower leg: No edema.     Left lower leg: No edema.  Lymphadenopathy:     Upper Body:     Right upper body: No supraclavicular, axillary or pectoral adenopathy.     Left upper body: No supraclavicular, axillary or pectoral adenopathy.  Skin:    General: Skin is warm and dry.     Capillary Refill: Capillary refill takes less than 2 seconds.  Neurological:     General: No focal deficit present.     Mental Status: She is alert and oriented to person, place, and time.     Cranial Nerves: No cranial nerve deficit.     Sensory: No sensory deficit.  Psychiatric:        Mood and Affect: Mood normal.        Behavior: Behavior normal.        Thought Content: Thought content normal.        Judgment: Judgment normal.      Assessment And Plan:     Encounter for annual health examination Assessment & Plan: Behavior modifications discussed and diet history reviewed.   Pt will continue to exercise regularly and modify diet with low GI, plant based foods and  decrease intake of processed foods.  Recommend intake of daily multivitamin, Vitamin D, and calcium.  Recommend monthly self breast exams for preventive screenings, as well as recommend immunizations that include influenza, TDAP Received flu shot on September 27th. Discussed importance of regular dental visits. - Uploaded flu shot documentation to MyChart. - Provided list of dentists for regular dental visits.  Orders: -     CBC with Differential/Platelet -     CMP14+EGFR  Prediabetes Assessment & Plan: Stable A1c.  Following dietary and physical activity recommendations. - Recheck A1c today. - Continue dietary modifications and physical activity.  Orders: -     CMP14+EGFR -     Hemoglobin A1c  Screening for STDs (sexually transmitted diseases) -     NuSwab Vaginitis Plus (VG+) -     RPR W/RFLX TO RPR TITER, TREPONEMAL AB, SCREEN AND DIAGNOSIS  Encounter for lipid screening for cardiovascular disease -     Lipid panel  Encounter for HIV (human immunodeficiency virus) test -     HIV Antibody (routine testing w rflx)  Class 2 severe obesity due to excess calories with serious comorbidity and body mass index (BMI) of 37.0 to 37.9 in adult Assessment & Plan: BMI decreased from 233 lbs to 206 lbs with Wegovy . Discussed lifestyle changes and switching to Zepbound  due to maximum Wegovy  dose. - Switched to Zepbound  5 mg, pending insurance approval. - Encouraged lifestyle modifications: increased physical activity and dietary changes. - Scheduled follow-up in two months to assess weight progress.  Orders: -     Zepbound ; Inject 5 mg into the skin once a week.  Dispense: 2 mL; Refill: 1  History of PCOS Assessment & Plan: PCOS symptoms include abdominal pain and persistent symptoms. Discussed weight management benefits on PCOS. - Continue weight management with Zepbound . - Advised proactive management of menstrual pain with ibuprofen or Aleve.   Vaping nicotine  dependence,  non-tobacco product Assessment & Plan: She uses a vape with  nicotine  and is interested in quitting. Discussed nicotine  patches and employer cessation programs. - Recommended starting with nicotine  patches for smoking cessation. - Advised contacting employer's HR for potential smoking cessation programs.    Return for 1 year physical, 2 months weight check, 65M PRE DM. Patient was given opportunity to ask questions. Patient verbalized understanding of the plan and was able to repeat key elements of the plan. All questions were answered to their satisfaction.   Taylor Ada, FNP  I, Taylor Ada, FNP, have reviewed all documentation for this visit. The documentation on 02/27/2024 for the exam, diagnosis, procedures, and orders are all accurate and complete.      [1]  Current Outpatient Medications:    tirzepatide  (ZEPBOUND ) 5 MG/0.5ML Pen, Inject 5 mg into the skin once a week., Disp: 2 mL, Rfl: 1 [2] No Known Allergies

## 2024-03-01 ENCOUNTER — Other Ambulatory Visit

## 2024-03-01 ENCOUNTER — Encounter: Payer: 59 | Admitting: Nurse Practitioner

## 2024-03-02 LAB — CMP14+EGFR
ALT: 19 IU/L (ref 0–32)
AST: 21 IU/L (ref 0–40)
Albumin: 4.4 g/dL (ref 3.9–4.9)
Alkaline Phosphatase: 67 IU/L (ref 41–116)
BUN/Creatinine Ratio: 22 (ref 9–23)
BUN: 13 mg/dL (ref 6–20)
Bilirubin Total: 0.3 mg/dL (ref 0.0–1.2)
CO2: 21 mmol/L (ref 20–29)
Calcium: 9.7 mg/dL (ref 8.7–10.2)
Chloride: 99 mmol/L (ref 96–106)
Creatinine, Ser: 0.59 mg/dL (ref 0.57–1.00)
Globulin, Total: 2.5 g/dL (ref 1.5–4.5)
Glucose: 70 mg/dL (ref 70–99)
Potassium: 4.5 mmol/L (ref 3.5–5.2)
Sodium: 136 mmol/L (ref 134–144)
Total Protein: 6.9 g/dL (ref 6.0–8.5)
eGFR: 120 mL/min/1.73

## 2024-03-02 LAB — CBC WITH DIFFERENTIAL/PLATELET
Basophils Absolute: 0 x10E3/uL (ref 0.0–0.2)
Basos: 0 %
EOS (ABSOLUTE): 0.1 x10E3/uL (ref 0.0–0.4)
Eos: 2 %
Hematocrit: 38.5 % (ref 34.0–46.6)
Hemoglobin: 12.2 g/dL (ref 11.1–15.9)
Immature Grans (Abs): 0 x10E3/uL (ref 0.0–0.1)
Immature Granulocytes: 0 %
Lymphocytes Absolute: 1.9 x10E3/uL (ref 0.7–3.1)
Lymphs: 29 %
MCH: 26.2 pg — ABNORMAL LOW (ref 26.6–33.0)
MCHC: 31.7 g/dL (ref 31.5–35.7)
MCV: 83 fL (ref 79–97)
Monocytes Absolute: 0.6 x10E3/uL (ref 0.1–0.9)
Monocytes: 9 %
Neutrophils Absolute: 3.9 x10E3/uL (ref 1.4–7.0)
Neutrophils: 60 %
Platelets: 369 x10E3/uL (ref 150–450)
RBC: 4.65 x10E6/uL (ref 3.77–5.28)
RDW: 16.2 % — ABNORMAL HIGH (ref 11.7–15.4)
WBC: 6.5 x10E3/uL (ref 3.4–10.8)

## 2024-03-02 LAB — LIPID PANEL
Chol/HDL Ratio: 3 ratio (ref 0.0–4.4)
Cholesterol, Total: 189 mg/dL (ref 100–199)
HDL: 64 mg/dL
LDL Chol Calc (NIH): 111 mg/dL — ABNORMAL HIGH (ref 0–99)
Triglycerides: 79 mg/dL (ref 0–149)
VLDL Cholesterol Cal: 14 mg/dL (ref 5–40)

## 2024-03-02 LAB — NUSWAB VAGINITIS PLUS (VG+)
Candida albicans, NAA: POSITIVE — AB
Candida glabrata, NAA: NEGATIVE
Chlamydia trachomatis, NAA: NEGATIVE
Neisseria gonorrhoeae, NAA: NEGATIVE
Trich vag by NAA: NEGATIVE

## 2024-03-02 LAB — SYPHILIS: RPR W/REFLEX TO RPR TITER AND TREPONEMAL ANTIBODIES, TRADITIONAL SCREENING AND DIAGNOSIS ALGORITHM: RPR Ser Ql: NONREACTIVE

## 2024-03-02 LAB — HEMOGLOBIN A1C
Est. average glucose Bld gHb Est-mCnc: 105 mg/dL
Hgb A1c MFr Bld: 5.3 % (ref 4.8–5.6)

## 2024-03-02 LAB — HIV ANTIBODY (ROUTINE TESTING W REFLEX): HIV Screen 4th Generation wRfx: NONREACTIVE

## 2024-03-07 ENCOUNTER — Ambulatory Visit: Payer: Self-pay | Admitting: Nurse Practitioner

## 2024-03-07 DIAGNOSIS — Z8742 Personal history of other diseases of the female genital tract: Secondary | ICD-10-CM | POA: Insufficient documentation

## 2024-03-07 MED ORDER — FLUCONAZOLE 100 MG PO TABS: 100.0000 mg | ORAL_TABLET | Freq: Every day | ORAL | 0 refills | Status: AC

## 2024-03-07 NOTE — Assessment & Plan Note (Signed)
 She uses a vape with nicotine  and is interested in quitting. Discussed nicotine  patches and employer cessation programs. - Recommended starting with nicotine  patches for smoking cessation. - Advised contacting employer's HR for potential smoking cessation programs.

## 2024-03-07 NOTE — Assessment & Plan Note (Signed)
 Behavior modifications discussed and diet history reviewed.   Pt will continue to exercise regularly and modify diet with low GI, plant based foods and decrease intake of processed foods.  Recommend intake of daily multivitamin, Vitamin D, and calcium.  Recommend monthly self breast exams for preventive screenings, as well as recommend immunizations that include influenza, TDAP Received flu shot on September 27th. Discussed importance of regular dental visits. - Uploaded flu shot documentation to MyChart. - Provided list of dentists for regular dental visits.

## 2024-03-07 NOTE — Assessment & Plan Note (Signed)
 Stable A1c.  Following dietary and physical activity recommendations. - Recheck A1c today. - Continue dietary modifications and physical activity.

## 2024-03-07 NOTE — Assessment & Plan Note (Signed)
 BMI decreased from 233 lbs to 206 lbs with Wegovy . Discussed lifestyle changes and switching to Zepbound  due to maximum Wegovy  dose. - Switched to Zepbound  5 mg, pending insurance approval. - Encouraged lifestyle modifications: increased physical activity and dietary changes. - Scheduled follow-up in two months to assess weight progress.

## 2024-03-07 NOTE — Assessment & Plan Note (Signed)
 PCOS symptoms include abdominal pain and persistent symptoms. Discussed weight management benefits on PCOS. - Continue weight management with Zepbound . - Advised proactive management of menstrual pain with ibuprofen or Aleve.

## 2024-03-22 ENCOUNTER — Other Ambulatory Visit: Payer: Self-pay | Admitting: Nurse Practitioner

## 2024-03-22 DIAGNOSIS — E66813 Obesity, class 3: Secondary | ICD-10-CM

## 2024-03-22 DIAGNOSIS — R7303 Prediabetes: Secondary | ICD-10-CM

## 2024-04-29 ENCOUNTER — Ambulatory Visit: Payer: Self-pay | Admitting: Nurse Practitioner

## 2024-08-30 ENCOUNTER — Ambulatory Visit: Payer: Self-pay | Admitting: Nurse Practitioner

## 2025-03-02 ENCOUNTER — Encounter: Payer: Self-pay | Admitting: Nurse Practitioner
